# Patient Record
Sex: Female | Born: 1950 | Race: White | Hispanic: No | Smoking: Never smoker
Health system: Southern US, Community
[De-identification: ages and names within clinical notes are randomized; demographics above are authoritative.]

## PROBLEM LIST (undated history)

## (undated) DIAGNOSIS — M810 Age-related osteoporosis without current pathological fracture: Secondary | ICD-10-CM

## (undated) DIAGNOSIS — R87610 Atypical squamous cells of undetermined significance on cytologic smear of cervix (ASC-US): Secondary | ICD-10-CM

## (undated) DIAGNOSIS — R3129 Other microscopic hematuria: Secondary | ICD-10-CM

## (undated) DIAGNOSIS — K5790 Diverticulosis of intestine, part unspecified, without perforation or abscess without bleeding: Secondary | ICD-10-CM

## (undated) DIAGNOSIS — R87810 Cervical high risk human papillomavirus (HPV) DNA test positive: Secondary | ICD-10-CM

## (undated) HISTORY — DX: Atypical squamous cells of undetermined significance on cytologic smear of cervix (ASC-US): R87.610

## (undated) HISTORY — DX: Diverticulosis of intestine, part unspecified, without perforation or abscess without bleeding: K57.90

## (undated) HISTORY — DX: Age-related osteoporosis without current pathological fracture: M81.0

## (undated) HISTORY — DX: Cervical high risk human papillomavirus (HPV) DNA test positive: R87.810

## (undated) HISTORY — DX: Other microscopic hematuria: R31.29

---

## 2002-07-20 LAB — HM COLONOSCOPY

## 2010-11-24 ENCOUNTER — Ambulatory Visit: Payer: Self-pay | Admitting: Family Medicine

## 2011-12-03 ENCOUNTER — Ambulatory Visit: Payer: Self-pay | Admitting: Family Medicine

## 2012-11-28 LAB — HM DEXA SCAN

## 2012-12-05 ENCOUNTER — Ambulatory Visit: Payer: Self-pay | Admitting: Family Medicine

## 2012-12-23 ENCOUNTER — Ambulatory Visit: Payer: Self-pay | Admitting: Urology

## 2013-11-02 LAB — HM PAP SMEAR

## 2013-12-06 ENCOUNTER — Ambulatory Visit: Payer: Self-pay | Admitting: Family Medicine

## 2013-12-06 LAB — HM MAMMOGRAPHY

## 2014-01-09 ENCOUNTER — Ambulatory Visit: Payer: Self-pay | Admitting: Family Medicine

## 2014-10-29 ENCOUNTER — Other Ambulatory Visit: Payer: Self-pay

## 2014-10-29 MED ORDER — RALOXIFENE HCL 60 MG PO TABS: 60.0000 mg | ORAL_TABLET | Freq: Every day | ORAL | Status: DC

## 2014-10-29 NOTE — Telephone Encounter (Signed)
approved

## 2014-10-29 NOTE — Telephone Encounter (Signed)
Updated the message, I forgot to hit to "reorder" the medication.

## 2014-11-02 DIAGNOSIS — K5790 Diverticulosis of intestine, part unspecified, without perforation or abscess without bleeding: Secondary | ICD-10-CM | POA: Insufficient documentation

## 2014-11-02 DIAGNOSIS — M8580 Other specified disorders of bone density and structure, unspecified site: Secondary | ICD-10-CM | POA: Insufficient documentation

## 2014-11-02 DIAGNOSIS — R87810 Cervical high risk human papillomavirus (HPV) DNA test positive: Secondary | ICD-10-CM

## 2014-11-02 DIAGNOSIS — R87610 Atypical squamous cells of undetermined significance on cytologic smear of cervix (ASC-US): Secondary | ICD-10-CM | POA: Insufficient documentation

## 2014-11-02 DIAGNOSIS — R3129 Other microscopic hematuria: Secondary | ICD-10-CM | POA: Insufficient documentation

## 2014-11-12 ENCOUNTER — Encounter: Payer: Self-pay | Admitting: Family Medicine

## 2014-11-12 ENCOUNTER — Ambulatory Visit (INDEPENDENT_AMBULATORY_CARE_PROVIDER_SITE_OTHER): Payer: Medicare Other | Admitting: Family Medicine

## 2014-11-12 VITALS — BP 152/69 | HR 74 | Temp 98.4°F | Ht 59.75 in | Wt 125.0 lb

## 2014-11-12 DIAGNOSIS — M8580 Other specified disorders of bone density and structure, unspecified site: Secondary | ICD-10-CM

## 2014-11-12 DIAGNOSIS — Z1211 Encounter for screening for malignant neoplasm of colon: Secondary | ICD-10-CM | POA: Insufficient documentation

## 2014-11-12 DIAGNOSIS — Z0000 Encounter for general adult medical examination without abnormal findings: Secondary | ICD-10-CM

## 2014-11-12 MED ORDER — RALOXIFENE HCL 60 MG PO TABS: 60.0000 mg | ORAL_TABLET | Freq: Every day | ORAL | Status: DC

## 2014-11-12 NOTE — Patient Instructions (Addendum)
Your goal blood pressure is less than 150 mmHg on top. Try to follow the DASH guidelines (DASH stands for Dietary Approaches to Stop Hypertension) Try to limit the sodium in your diet.  Ideally, consume less than 1.5 grams (less than 1,554m) per day. Do not add salt when cooking or at the table.  Check the sodium amount on labels when shopping, and choose items lower in sodium when given a choice. Avoid or limit foods that already contain a lot of sodium. Eat a diet rich in fruits and vegetables and whole grains.  You can get your next cholesterol in October of 2020 and your next glucose in October of 2018 I have ordered the Cologuard test If you have not heard anything from my staff in a week about any orders/referrals/studies from today, please contact [facility] to follow-up [phone] You can have the mammogram done November 19th or later; mammograms every 1-2 years You can have your bone density test done November 11th or later; every 2 years  Health Maintenance, Female Adopting a healthy lifestyle and getting preventive care can go a long way to promote health and wellness. Talk with your health care provider about what schedule of regular examinations is right for you. This is a good chance for you to check in with your provider about disease prevention and staying healthy. In between checkups, there are plenty of things you can do on your own. Experts have done a lot of research about which lifestyle changes and preventive measures are most likely to keep you healthy. Ask your health care provider for more information. WEIGHT AND DIET  Eat a healthy diet  Be sure to include plenty of vegetables, fruits, low-fat dairy products, and lean protein.  Do not eat a lot of foods high in solid fats, added sugars, or salt.  Get regular exercise. This is one of the most important things you can do for your health.  Most adults should exercise for at least 150 minutes each week. The exercise  should increase your heart rate and make you sweat (moderate-intensity exercise).  Most adults should also do strengthening exercises at least twice a week. This is in addition to the moderate-intensity exercise.  Maintain a healthy weight  Body mass index (BMI) is a measurement that can be used to identify possible weight problems. It estimates body fat based on height and weight. Your health care provider can help determine your BMI and help you achieve or maintain a healthy weight.  For females 259years of age and older:   A BMI below 18.5 is considered underweight.  A BMI of 18.5 to 24.9 is normal.  A BMI of 25 to 29.9 is considered overweight.  A BMI of 30 and above is considered obese.  Watch levels of cholesterol and blood lipids  You should start having your blood tested for lipids and cholesterol at 66years of age, then have this test every 5 years.  You may need to have your cholesterol levels checked more often if:  Your lipid or cholesterol levels are high.  You are older than 66years of age.  You are at high risk for heart disease.  CANCER SCREENING   Lung Cancer  Lung cancer screening is recommended for adults 560860years old who are at high risk for lung cancer because of a history of smoking.  A yearly low-dose CT scan of the lungs is recommended for people who:  Currently smoke.  Have quit within the  past 15 years.  Have at least a 30-pack-year history of smoking. A pack year is smoking an average of one pack of cigarettes a day for 1 year.  Yearly screening should continue until it has been 15 years since you quit.  Yearly screening should stop if you develop a health problem that would prevent you from having lung cancer treatment.  Breast Cancer  Practice breast self-awareness. This means understanding how your breasts normally appear and feel.  It also means doing regular breast self-exams. Let your health care provider know about any  changes, no matter how small.  If you are in your 20s or 30s, you should have a clinical breast exam (CBE) by a health care provider every 1-3 years as part of a regular health exam.  If you are 61 or older, have a CBE every year. Also consider having a breast X-ray (mammogram) every year.  If you have a family history of breast cancer, talk to your health care provider about genetic screening.  If you are at high risk for breast cancer, talk to your health care provider about having an MRI and a mammogram every year.  Breast cancer gene (BRCA) assessment is recommended for women who have family members with BRCA-related cancers. BRCA-related cancers include:  Breast.  Ovarian.  Tubal.  Peritoneal cancers.  Results of the assessment will determine the need for genetic counseling and BRCA1 and BRCA2 testing. Cervical Cancer Your health care provider may recommend that you be screened regularly for cancer of the pelvic organs (ovaries, uterus, and vagina). This screening involves a pelvic examination, including checking for microscopic changes to the surface of your cervix (Pap test). You may be encouraged to have this screening done every 3 years, beginning at age 46.  For women ages 81-65, health care providers may recommend pelvic exams and Pap testing every 3 years, or they may recommend the Pap and pelvic exam, combined with testing for human papilloma virus (HPV), every 5 years. Some types of HPV increase your risk of cervical cancer. Testing for HPV may also be done on women of any age with unclear Pap test results.  Other health care providers may not recommend any screening for nonpregnant women who are considered low risk for pelvic cancer and who do not have symptoms. Ask your health care provider if a screening pelvic exam is right for you.  If you have had past treatment for cervical cancer or a condition that could lead to cancer, you need Pap tests and screening for cancer  for at least 20 years after your treatment. If Pap tests have been discontinued, your risk factors (such as having a new sexual partner) need to be reassessed to determine if screening should resume. Some women have medical problems that increase the chance of getting cervical cancer. In these cases, your health care provider may recommend more frequent screening and Pap tests. Colorectal Cancer  This type of cancer can be detected and often prevented.  Routine colorectal cancer screening usually begins at 66 years of age and continues through 66 years of age.  Your health care provider may recommend screening at an earlier age if you have risk factors for colon cancer.  Your health care provider may also recommend using home test kits to check for hidden blood in the stool.  A small camera at the end of a tube can be used to examine your colon directly (sigmoidoscopy or colonoscopy). This is done to check for the earliest forms  of colorectal cancer.  Routine screening usually begins at age 41.  Direct examination of the colon should be repeated every 5-10 years through 66 years of age. However, you may need to be screened more often if early forms of precancerous polyps or small growths are found. Skin Cancer  Check your skin from head to toe regularly.  Tell your health care provider about any new moles or changes in moles, especially if there is a change in a mole's shape or color.  Also tell your health care provider if you have a mole that is larger than the size of a pencil eraser.  Always use sunscreen. Apply sunscreen liberally and repeatedly throughout the day.  Protect yourself by wearing long sleeves, pants, a wide-brimmed hat, and sunglasses whenever you are outside. HEART DISEASE, DIABETES, AND HIGH BLOOD PRESSURE   High blood pressure causes heart disease and increases the risk of stroke. High blood pressure is more likely to develop in:  People who have blood pressure in  the high end of the normal range (130-139/85-89 mm Hg).  People who are overweight or obese.  People who are African American.  If you are 50-90 years of age, have your blood pressure checked every 3-5 years. If you are 1 years of age or older, have your blood pressure checked every year. You should have your blood pressure measured twice--once when you are at a hospital or clinic, and once when you are not at a hospital or clinic. Record the average of the two measurements. To check your blood pressure when you are not at a hospital or clinic, you can use:  An automated blood pressure machine at a pharmacy.  A home blood pressure monitor.  If you are between 64 years and 58 years old, ask your health care provider if you should take aspirin to prevent strokes.  Have regular diabetes screenings. This involves taking a blood sample to check your fasting blood sugar level.  If you are at a normal weight and have a low risk for diabetes, have this test once every three years after 66 years of age.  If you are overweight and have a high risk for diabetes, consider being tested at a younger age or more often. PREVENTING INFECTION  Hepatitis B  If you have a higher risk for hepatitis B, you should be screened for this virus. You are considered at high risk for hepatitis B if:  You were born in a country where hepatitis B is common. Ask your health care provider which countries are considered high risk.  Your parents were born in a high-risk country, and you have not been immunized against hepatitis B (hepatitis B vaccine).  You have HIV or AIDS.  You use needles to inject street drugs.  You live with someone who has hepatitis B.  You have had sex with someone who has hepatitis B.  You get hemodialysis treatment.  You take certain medicines for conditions, including cancer, organ transplantation, and autoimmune conditions. Hepatitis C  Blood testing is recommended for:  Everyone  born from [birth year] through 1967.  Anyone with known risk factors for hepatitis C. Sexually transmitted infections (STIs)  You should be screened for sexually transmitted infections (STIs) including gonorrhea and chlamydia if:  You are sexually active and are younger than 66 years of age.  You are older than 66 years of age and your health care provider tells you that you are at risk for this type of infection.  Your  sexual activity has changed since you were last screened and you are at an increased risk for chlamydia or gonorrhea. Ask your health care provider if you are at risk.  If you do not have HIV, but are at risk, it may be recommended that you take a prescription medicine daily to prevent HIV infection. This is called pre-exposure prophylaxis (PrEP). You are considered at risk if:  You are sexually active and do not regularly use condoms or know the HIV status of your partner(s).  You take drugs by injection.  You are sexually active with a partner who has HIV. Talk with your health care provider about whether you are at high risk of being infected with HIV. If you choose to begin PrEP, you should first be tested for HIV. You should then be tested every 3 months for as long as you are taking PrEP.  PREGNANCY   If you are premenopausal and you may become pregnant, ask your health care provider about preconception counseling.  If you may become pregnant, take 400 to 800 micrograms (mcg) of folic acid every day.  If you want to prevent pregnancy, talk to your health care provider about birth control (contraception). OSTEOPOROSIS AND MENOPAUSE   Osteoporosis is a disease in which the bones lose minerals and strength with aging. This can result in serious bone fractures. Your risk for osteoporosis can be identified using a bone density scan.  If you are 65 years of age or older, or if you are at risk for osteoporosis and fractures, ask your health care provider if you should be  screened.  Ask your health care provider whether you should take a calcium or vitamin D supplement to lower your risk for osteoporosis.  Menopause may have certain physical symptoms and risks.  Hormone replacement therapy may reduce some of these symptoms and risks. Talk to your health care provider about whether hormone replacement therapy is right for you.  HOME CARE INSTRUCTIONS   Schedule regular health, dental, and eye exams.  Stay current with your immunizations.   Do not use any tobacco products including cigarettes, chewing tobacco, or electronic cigarettes.  If you are pregnant, do not drink alcohol.  If you are breastfeeding, limit how much and how often you drink alcohol.  Limit alcohol intake to no more than 1 drink per day for nonpregnant women. One drink equals 12 ounces of beer, 5 ounces of wine, or 1 ounces of hard liquor.  Do not use street drugs.  Do not share needles.  Ask your health care provider for help if you need support or information about quitting drugs.  Tell your health care provider if you often feel depressed.  Tell your health care provider if you have ever been abused or do not feel safe at home.   This information is not intended to replace advice given to you by your health care provider. Make sure you discuss any questions you have with your health care provider.   Document Released: 07/21/2010 Document Revised: 01/26/2014 Document Reviewed: 12/07/2012 Elsevier Interactive Patient Education [employer].

## 2014-11-12 NOTE — Progress Notes (Signed)
Patient ID: Chelsea Hughes, female   DOB: 1950/01/28, 64 y.o.   MRN: [id number]   Subjective:   Chelsea Hughes is a 64 y.o. female here for a complete physical exam  Interim issues since last visit: none  USPSTF grade A and B recommendations Alcohol: wine less than 7 drinks per week Depression:  Depression screen [facility] 2/9 11/12/2014  Decreased Interest 0  Down, Depressed, Hopeless 0  PHQ - 2 Score 0  Hypertension: up a little today; discussed Obesity: no, but did gain a little weight since last year Tobacco use: no HIV, hep B, hep C: politely declined STD testing and prevention (chl/gon/syphilis): politely declined Lipids: excellent; reviewed last panel Glucose: excellent; reviewed last glucose Colorectal cancer:  She does not do the colonoscopy Breast cancer: ordered today BRCA gene screening: no ovarian or breast cancer in the family Intimate partner violence: no Cervical cancer screening: UTD; next in 2 years, that should be last one Lung cancer: n/a Osteoporosis: osteopenia, last DEXA Nov 2014 Fall prevention/vitamin D: discussed AAA: n/a Aspirin: taking 81 mg daily Diet: good diet Exercise: garden, walks Skin cancer: uses sunscreen, sees derm yearly  Past Medical History  Diagnosis Date  . Microscopic hematuria     evaluated by Urology in 2014, abd/pelvis CT scan Dec 2014  . Osteoporosis   . Diverticulosis     on Colonoscopy 2007; CT scan 2014  . Atypical squamous cell changes of undetermined significance (ASCUS) on cervical cytology with positive high risk human papilloma virus (HPV)   Three negative pap smears since then, 2015, 2012, 2009, all NIL and satisfactory and negative  History reviewed. No pertinent past surgical history. Family History  Problem Relation Age of Onset  . Heart disease Mother     CABG  . Hypertension Mother   . Osteoporosis Mother   . Thyroid disease Mother   . Heart disease Father   . Hypertension Father   . Cancer Father    throat (smoker)  . COPD Neg Hx   . Diabetes Neg Hx   . Stroke Neg Hx    Social History  Substance Use Topics  . Smoking status: Never Smoker   . Smokeless tobacco: Never Used  . Alcohol Use: Yes     Comment: wine   Review of Systems  Constitutional: Negative for fever and unexpected weight change.  HENT: Negative for hearing loss and nosebleeds.   Eyes: Negative for visual disturbance.  Respiratory: Negative for shortness of breath and wheezing.   Cardiovascular: Negative for chest pain and leg swelling.  Gastrointestinal: Negative for abdominal pain and blood in stool.  Endocrine: Negative for polydipsia and polyuria.  Genitourinary: Negative for hematuria and pelvic pain.  Musculoskeletal: Negative for joint swelling and arthralgias.  Skin:       No worrisome moles  Allergic/Immunologic: Negative for food allergies.  Neurological: Negative for tremors.  Hematological: Negative for adenopathy. Does not bruise/bleed easily.  Psychiatric/Behavioral: Negative for dysphoric mood.    Objective:   Filed Vitals:   11/12/14 1409  BP: 152/69  Pulse: 74  Temp: 98.4 F (36.9 C)  Height: 4' 11.75" (1.518 m)  Weight: 125 lb (56.7 kg)  SpO2: 98%   Body mass index is 24.61 kg/(m^2). Wt Readings from Last 3 Encounters:  11/12/14 125 lb (56.7 kg)  01/01/14 121 lb (54.885 kg)   Physical Exam  Constitutional: She appears well-developed and well-nourished.  HENT:  Head: Normocephalic and atraumatic.  Right Ear: Hearing, tympanic membrane, external ear and  ear canal normal.  Left Ear: Hearing, tympanic membrane, external ear and ear canal normal.  Eyes: Conjunctivae and EOM are normal. Right eye exhibits no hordeolum. Left eye exhibits no hordeolum. No scleral icterus.  Neck: Carotid bruit is not present. No thyromegaly present.  Cardiovascular: Normal rate, regular rhythm, S1 normal, S2 normal and normal heart sounds.   No extrasystoles are present.  Pulmonary/Chest: Effort  normal and breath sounds normal. No respiratory distress. Right breast exhibits no inverted nipple, no mass, no nipple discharge, no skin change and no tenderness. Left breast exhibits no inverted nipple, no mass, no nipple discharge, no skin change and no tenderness. Breasts are symmetrical.  Abdominal: Soft. Normal appearance and bowel sounds are normal. She exhibits no distension, no abdominal bruit, no pulsatile midline mass and no mass. There is no hepatosplenomegaly. There is no tenderness. No hernia.  Musculoskeletal: Normal range of motion. She exhibits no edema.  Lymphadenopathy:       Head (right side): No submandibular adenopathy present.       Head (left side): No submandibular adenopathy present.    She has no cervical adenopathy.    She has no axillary adenopathy.  Neurological: She is alert. She displays no tremor. No cranial nerve deficit. She exhibits normal muscle tone. Gait normal.  Reflex Scores:      Patellar reflexes are 2+ on the right side and 2+ on the left side. Skin: Skin is warm and dry. No bruising and no ecchymosis noted. No cyanosis. No pallor.  Psychiatric: Her speech is normal and behavior is normal. Thought content normal. Her mood appears not anxious. She does not exhibit a depressed mood.   Assessment/Plan:   Problem List Items Addressed This Visit      Musculoskeletal and Integument   Osteopenia    Due Nov 11th or just after; every 2 years; continue calcium and vit D; fall precautions        Other   Colon cancer screening    She is not interested in having a colonoscopy; explained this is the gold standard, but another test available for patients who do not want colonoscopy; will order Cologuard      Relevant Orders   Cologuard   Preventative health care - Primary    USPSTF grade A and B recommendations reviewed with patient; age-appropriate recommendations, preventive care, screening tests, etc discussed and encouraged; healthy living encouraged;  see AVS for patient education given to patient         Follow up plan: Return in about 1 year (around 11/12/2015) for complete physical, medicare wellness when due.  An after-visit summary was printed and given to the patient at [facility].  Please see the patient instructions which may contain other information and recommendations beyond what is mentioned above in the assessment and plan.

## 2014-11-12 NOTE — Assessment & Plan Note (Signed)
Due Nov 11th or just after; every 2 years; continue calcium and vit D; fall precautions

## 2014-11-17 DIAGNOSIS — Z0000 Encounter for general adult medical examination without abnormal findings: Secondary | ICD-10-CM | POA: Insufficient documentation

## 2014-11-17 NOTE — Assessment & Plan Note (Signed)
USPSTF grade A and B recommendations reviewed with patient; age-appropriate recommendations, preventive care, screening tests, etc discussed and encouraged; healthy living encouraged; see AVS for patient education given to patient  

## 2014-11-17 NOTE — Assessment & Plan Note (Signed)
She is not interested in having a colonoscopy; explained this is the gold standard, but another test available for patients who do not want colonoscopy; will order Cologuard

## 2014-12-11 ENCOUNTER — Other Ambulatory Visit: Payer: Self-pay

## 2014-12-11 DIAGNOSIS — M8580 Other specified disorders of bone density and structure, unspecified site: Secondary | ICD-10-CM

## 2014-12-12 ENCOUNTER — Other Ambulatory Visit: Payer: Self-pay | Admitting: Family Medicine

## 2014-12-12 DIAGNOSIS — Z1231 Encounter for screening mammogram for malignant neoplasm of breast: Secondary | ICD-10-CM

## 2014-12-26 ENCOUNTER — Ambulatory Visit
Admission: RE | Admit: 2014-12-26 | Discharge: 2014-12-26 | Disposition: A | Discharge status: Home / Self Care | Payer: Medicare Other | Source: Ambulatory Visit | Attending: Family Medicine | Admitting: Family Medicine

## 2014-12-26 DIAGNOSIS — Z13820 Encounter for screening for osteoporosis: Secondary | ICD-10-CM | POA: Insufficient documentation

## 2014-12-26 DIAGNOSIS — M8580 Other specified disorders of bone density and structure, unspecified site: Secondary | ICD-10-CM | POA: Insufficient documentation

## 2014-12-26 DIAGNOSIS — Z1231 Encounter for screening mammogram for malignant neoplasm of breast: Secondary | ICD-10-CM | POA: Insufficient documentation

## 2014-12-31 ENCOUNTER — Telehealth: Payer: Self-pay | Admitting: Family Medicine

## 2014-12-31 DIAGNOSIS — M8580 Other specified disorders of bone density and structure, unspecified site: Secondary | ICD-10-CM

## 2014-12-31 NOTE — Telephone Encounter (Signed)
Her DEXA showed osteopenia; I will call her tomorrow

## 2015-01-01 NOTE — Telephone Encounter (Signed)
Discussed DEXA; continue evista, no probs with it; 3 servings calcium daily, vit D 1000 daily, weight-bearing exercise, fall precautions; patient agrees

## 2015-01-03 LAB — COLOGUARD: COLOGUARD: NEGATIVE

## 2015-01-11 ENCOUNTER — Telehealth: Payer: Self-pay | Admitting: Family Medicine

## 2015-01-11 NOTE — Telephone Encounter (Signed)
Cologuard done on Jan 03, 2015; please add to health maintenance Let patient know it was negative; recheck in 1-3 years

## 2015-01-23 NOTE — Telephone Encounter (Signed)
Left message to call. Health Maintenance updated.

## 2015-01-23 NOTE — Telephone Encounter (Signed)
Amy, please give patient good news noted below; update HM

## 2015-01-24 NOTE — Telephone Encounter (Signed)
Left message to call.

## 2015-01-29 NOTE — Telephone Encounter (Signed)
Patient notified

## 2015-11-25 ENCOUNTER — Other Ambulatory Visit: Payer: Self-pay | Admitting: Family Medicine

## 2015-11-25 NOTE — Telephone Encounter (Signed)
[facility], so [facility]; I left the practice; thank you

## 2015-11-25 NOTE — Telephone Encounter (Signed)
Your patient.  Thanks 

## 2015-11-25 NOTE — Telephone Encounter (Signed)
[facility]

## 2015-11-29 ENCOUNTER — Encounter: Payer: Self-pay | Admitting: Family Medicine

## 2015-11-29 ENCOUNTER — Ambulatory Visit (INDEPENDENT_AMBULATORY_CARE_PROVIDER_SITE_OTHER): Payer: Medicare Other | Admitting: Family Medicine

## 2015-11-29 VITALS — BP 133/73 | HR 71 | Temp 98.0°F | Ht 59.4 in | Wt 124.7 lb

## 2015-11-29 DIAGNOSIS — M8580 Other specified disorders of bone density and structure, unspecified site: Secondary | ICD-10-CM | POA: Diagnosis not present

## 2015-11-29 DIAGNOSIS — Z23 Encounter for immunization: Secondary | ICD-10-CM

## 2015-11-29 DIAGNOSIS — Z0000 Encounter for general adult medical examination without abnormal findings: Secondary | ICD-10-CM

## 2015-11-29 DIAGNOSIS — Z1159 Encounter for screening for other viral diseases: Secondary | ICD-10-CM | POA: Diagnosis not present

## 2015-11-29 DIAGNOSIS — R3129 Other microscopic hematuria: Secondary | ICD-10-CM

## 2015-11-29 DIAGNOSIS — Z1239 Encounter for other screening for malignant neoplasm of breast: Secondary | ICD-10-CM

## 2015-11-29 DIAGNOSIS — Z1231 Encounter for screening mammogram for malignant neoplasm of breast: Secondary | ICD-10-CM | POA: Diagnosis not present

## 2015-11-29 LAB — UA/M W/RFLX CULTURE, ROUTINE
BILIRUBIN UA: NEGATIVE
GLUCOSE, UA: NEGATIVE
KETONES UA: NEGATIVE
LEUKOCYTES UA: NEGATIVE
NITRITE UA: NEGATIVE
Protein, UA: NEGATIVE
Urobilinogen, Ur: 0.2 mg/dL (ref 0.2–1.0)
pH, UA: 5 (ref 5.0–7.5)

## 2015-11-29 LAB — MICROSCOPIC EXAMINATION
BACTERIA UA: NONE SEEN
WBC UA: NONE SEEN /HPF (ref 0–?)

## 2015-11-29 MED ORDER — RALOXIFENE HCL 60 MG PO TABS: ORAL_TABLET | ORAL | 4 refills | Status: DC

## 2015-11-29 NOTE — Patient Instructions (Addendum)
Preventative Services:  Health Risk Assessment and Personalized Prevention Plan: Done today Bone Mass Measurements: Due next year Breast Cancer Screening: Ordered today CVD Screening: Done today Cervical Cancer Screening: N/A Colon Cancer Screening: up to date Depression Screening: done today Diabetes Screening: Done today Glaucoma Screening: See your eye doctor Hepatitis B vaccine: n/a Hepatitis C screening: done today HIV Screening: N/A Flu Vaccine: up to date Lung cancer Screening: N/A Obesity Screening: done today Pneumonia Vaccines (2): completed today STI Screening: N/AHealth Maintenance, Female Adopting a healthy lifestyle and getting preventive care can go a long way to promote health and wellness. Talk with your health care provider about what schedule of regular examinations is right for you. This is a good chance for you to check in with your provider about disease prevention and staying healthy. In between checkups, there are plenty of things you can do on your own. Experts have done a lot of research about which lifestyle changes and preventive measures are most likely to keep you healthy. Ask your health care provider for more information. WEIGHT AND DIET  Eat a healthy diet  Be sure to include plenty of vegetables, fruits, low-fat dairy products, and lean protein.  Do not eat a lot of foods high in solid fats, added sugars, or salt.  Get regular exercise. This is one of the most important things you can do for your health.  Most adults should exercise for at least 150 minutes each week. The exercise should increase your heart rate and make you sweat (moderate-intensity exercise).  Most adults should also do strengthening exercises at least twice a week. This is in addition to the moderate-intensity exercise.  Maintain a healthy weight  Body mass index (BMI) is a measurement that can be used to identify possible weight problems. It estimates body fat based on height  and weight. Your health care provider can help determine your BMI and help you achieve or maintain a healthy weight.  For females 43 years of age and older:   A BMI below 18.5 is considered underweight.  A BMI of 18.5 to 24.9 is normal.  A BMI of 25 to 29.9 is considered overweight.  A BMI of 30 and above is considered obese.  Watch levels of cholesterol and blood lipids  You should start having your blood tested for lipids and cholesterol at 67 years of age, then have this test every 5 years.  You may need to have your cholesterol levels checked more often if:  Your lipid or cholesterol levels are high.  You are older than 67 years of age.  You are at high risk for heart disease.  CANCER SCREENING   Lung Cancer  Lung cancer screening is recommended for adults 28-53 years old who are at high risk for lung cancer because of a history of smoking.  A yearly low-dose CT scan of the lungs is recommended for people who:  Currently smoke.  Have quit within the past 15 years.  Have at least a 30-pack-year history of smoking. A pack year is smoking an average of one pack of cigarettes a day for 1 year.  Yearly screening should continue until it has been 15 years since you quit.  Yearly screening should stop if you develop a health problem that would prevent you from having lung cancer treatment.  Breast Cancer  Practice breast self-awareness. This means understanding how your breasts normally appear and feel.  It also means doing regular breast self-exams. Let your health care provider  know about any changes, no matter how small.  If you are in your 20s or 30s, you should have a clinical breast exam (CBE) by a health care provider every 1-3 years as part of a regular health exam.  If you are 11 or older, have a CBE every year. Also consider having a breast X-ray (mammogram) every year.  If you have a family history of breast cancer, talk to your health care provider about  genetic screening.  If you are at high risk for breast cancer, talk to your health care provider about having an MRI and a mammogram every year.  Breast cancer gene (BRCA) assessment is recommended for women who have family members with BRCA-related cancers. BRCA-related cancers include:  Breast.  Ovarian.  Tubal.  Peritoneal cancers.  Results of the assessment will determine the need for genetic counseling and BRCA1 and BRCA2 testing. Cervical Cancer Your health care provider may recommend that you be screened regularly for cancer of the pelvic organs (ovaries, uterus, and vagina). This screening involves a pelvic examination, including checking for microscopic changes to the surface of your cervix (Pap test). You may be encouraged to have this screening done every 3 years, beginning at age 24.  For women ages 25-65, health care providers may recommend pelvic exams and Pap testing every 3 years, or they may recommend the Pap and pelvic exam, combined with testing for human papilloma virus (HPV), every 5 years. Some types of HPV increase your risk of cervical cancer. Testing for HPV may also be done on women of any age with unclear Pap test results.  Other health care providers may not recommend any screening for nonpregnant women who are considered low risk for pelvic cancer and who do not have symptoms. Ask your health care provider if a screening pelvic exam is right for you.  If you have had past treatment for cervical cancer or a condition that could lead to cancer, you need Pap tests and screening for cancer for at least 20 years after your treatment. If Pap tests have been discontinued, your risk factors (such as having a new sexual partner) need to be reassessed to determine if screening should resume. Some women have medical problems that increase the chance of getting cervical cancer. In these cases, your health care provider may recommend more frequent screening and Pap  tests. Colorectal Cancer  This type of cancer can be detected and often prevented.  Routine colorectal cancer screening usually begins at 67 years of age and continues through 67 years of age.  Your health care provider may recommend screening at an earlier age if you have risk factors for colon cancer.  Your health care provider may also recommend using home test kits to check for hidden blood in the stool.  A small camera at the end of a tube can be used to examine your colon directly (sigmoidoscopy or colonoscopy). This is done to check for the earliest forms of colorectal cancer.  Routine screening usually begins at age 73.  Direct examination of the colon should be repeated every 5-10 years through 67 years of age. However, you may need to be screened more often if early forms of precancerous polyps or small growths are found. Skin Cancer  Check your skin from head to toe regularly.  Tell your health care provider about any new moles or changes in moles, especially if there is a change in a mole's shape or color.  Also tell your health care provider if  you have a mole that is larger than the size of a pencil eraser.  Always use sunscreen. Apply sunscreen liberally and repeatedly throughout the day.  Protect yourself by wearing long sleeves, pants, a wide-brimmed hat, and sunglasses whenever you are outside. HEART DISEASE, DIABETES, AND HIGH BLOOD PRESSURE   High blood pressure causes heart disease and increases the risk of stroke. High blood pressure is more likely to develop in:  People who have blood pressure in the high end of the normal range (130-139/85-89 mm Hg).  People who are overweight or obese.  People who are African American.  If you are 41-4 years of age, have your blood pressure checked every 3-5 years. If you are 47 years of age or older, have your blood pressure checked every year. You should have your blood pressure measured twice--once when you are at a  hospital or clinic, and once when you are not at a hospital or clinic. Record the average of the two measurements. To check your blood pressure when you are not at a hospital or clinic, you can use:  An automated blood pressure machine at a pharmacy.  A home blood pressure monitor.  If you are between 27 years and 1 years old, ask your health care provider if you should take aspirin to prevent strokes.  Have regular diabetes screenings. This involves taking a blood sample to check your fasting blood sugar level.  If you are at a normal weight and have a low risk for diabetes, have this test once every three years after 67 years of age.  If you are overweight and have a high risk for diabetes, consider being tested at a younger age or more often. PREVENTING INFECTION  Hepatitis B  If you have a higher risk for hepatitis B, you should be screened for this virus. You are considered at high risk for hepatitis B if:  You were born in a country where hepatitis B is common. Ask your health care provider which countries are considered high risk.  Your parents were born in a high-risk country, and you have not been immunized against hepatitis B (hepatitis B vaccine).  You have HIV or AIDS.  You use needles to inject street drugs.  You live with someone who has hepatitis B.  You have had sex with someone who has hepatitis B.  You get hemodialysis treatment.  You take certain medicines for conditions, including cancer, organ transplantation, and autoimmune conditions. Hepatitis C  Blood testing is recommended for:  Everyone born from [birth year] through 1967.  Anyone with known risk factors for hepatitis C. Sexually transmitted infections (STIs)  You should be screened for sexually transmitted infections (STIs) including gonorrhea and chlamydia if:  You are sexually active and are younger than 67 years of age.  You are older than 67 years of age and your health care provider tells you  that you are at risk for this type of infection.  Your sexual activity has changed since you were last screened and you are at an increased risk for chlamydia or gonorrhea. Ask your health care provider if you are at risk.  If you do not have HIV, but are at risk, it may be recommended that you take a prescription medicine daily to prevent HIV infection. This is called pre-exposure prophylaxis (PrEP). You are considered at risk if:  You are sexually active and do not regularly use condoms or know the HIV status of your partner(s).  You take drugs by injection.  You are sexually active with a partner who has HIV. Talk with your health care provider about whether you are at high risk of being infected with HIV. If you choose to begin PrEP, you should first be tested for HIV. You should then be tested every 3 months for as long as you are taking PrEP.  PREGNANCY   If you are premenopausal and you may become pregnant, ask your health care provider about preconception counseling.  If you may become pregnant, take 400 to 800 micrograms (mcg) of folic acid every day.  If you want to prevent pregnancy, talk to your health care provider about birth control (contraception). OSTEOPOROSIS AND MENOPAUSE   Osteoporosis is a disease in which the bones lose minerals and strength with aging. This can result in serious bone fractures. Your risk for osteoporosis can be identified using a bone density scan.  If you are 64 years of age or older, or if you are at risk for osteoporosis and fractures, ask your health care provider if you should be screened.  Ask your health care provider whether you should take a calcium or vitamin D supplement to lower your risk for osteoporosis.  Menopause may have certain physical symptoms and risks.  Hormone replacement therapy may reduce some of these symptoms and risks. Talk to your health care provider about whether hormone replacement therapy is right for you.  HOME  CARE INSTRUCTIONS   Schedule regular health, dental, and eye exams.  Stay current with your immunizations.   Do not use any tobacco products including cigarettes, chewing tobacco, or electronic cigarettes.  If you are pregnant, do not drink alcohol.  If you are breastfeeding, limit how much and how often you drink alcohol.  Limit alcohol intake to no more than 1 drink per day for nonpregnant women. One drink equals 12 ounces of beer, 5 ounces of wine, or 1 ounces of hard liquor.  Do not use street drugs.  Do not share needles.  Ask your health care provider for help if you need support or information about quitting drugs.  Tell your health care provider if you often feel depressed.  Tell your health care provider if you have ever been abused or do not feel safe at home.   This information is not intended to replace advice given to you by your health care provider. Make sure you discuss any questions you have with your health care provider.   Document Released: 07/21/2010 Document Revised: 01/26/2014 Document Reviewed: 12/07/2012 Elsevier Interactive Patient Education [employer]. Menopause is a normal process in which your reproductive ability comes to an end. This process happens gradually over a span of months to years, usually between the ages of 2 and 80. Menopause is complete when you have missed 12 consecutive menstrual periods. It is important to talk with your health care provider about some of the most common conditions that affect postmenopausal women, such as heart disease, cancer, and bone loss (osteoporosis). Adopting a healthy lifestyle and getting preventive care can help to promote your health and wellness. Those actions can also lower your chances of developing some of these common conditions. WHAT SHOULD I KNOW ABOUT MENOPAUSE? During menopause, you may experience a number of symptoms, such as:  Moderate-to-severe hot flashes.  Night sweats.  Decrease  in sex drive.  Mood swings.  Headaches.  Tiredness.  Irritability.  Memory problems.  Insomnia. Choosing to treat or not to treat menopausal changes is an individual decision that you make with  your health care provider. WHAT SHOULD I KNOW ABOUT HORMONE REPLACEMENT THERAPY AND SUPPLEMENTS? Hormone therapy products are effective for treating symptoms that are associated with menopause, such as hot flashes and night sweats. Hormone replacement carries certain risks, especially as you become older. If you are thinking about using estrogen or estrogen with progestin treatments, discuss the benefits and risks with your health care provider. WHAT SHOULD I KNOW ABOUT HEART DISEASE AND STROKE? Heart disease, heart attack, and stroke become more likely as you age. This may be due, in part, to the hormonal changes that your body experiences during menopause. These can affect how your body processes dietary fats, triglycerides, and cholesterol. Heart attack and stroke are both medical emergencies. There are many things that you can do to help prevent heart disease and stroke:  Have your blood pressure checked at least every 1-2 years. High blood pressure causes heart disease and increases the risk of stroke.  If you are 33-44 years old, ask your health care provider if you should take aspirin to prevent a heart attack or a stroke.  Do not use any tobacco products, including cigarettes, chewing tobacco, or electronic cigarettes. If you need help quitting, ask your health care provider.  It is important to eat a healthy diet and maintain a healthy weight.  Be sure to include plenty of vegetables, fruits, low-fat dairy products, and lean protein.  Avoid eating foods that are high in solid fats, added sugars, or salt (sodium).  Get regular exercise. This is one of the most important things that you can do for your health.  Try to exercise for at least 150 minutes each week. The type of exercise  that you do should increase your heart rate and make you sweat. This is known as moderate-intensity exercise.  Try to do strengthening exercises at least twice each week. Do these in addition to the moderate-intensity exercise.  Know your numbers.Ask your health care provider to check your cholesterol and your blood glucose. Continue to have your blood tested as directed by your health care provider. WHAT SHOULD I KNOW ABOUT CANCER SCREENING? There are several types of cancer. Take the following steps to reduce your risk and to catch any cancer development as early as possible. Breast Cancer  Practice breast self-awareness.  This means understanding how your breasts normally appear and feel.  It also means doing regular breast self-exams. Let your health care provider know about any changes, no matter how small.  If you are 69 or older, have a clinician do a breast exam (clinical breast exam or CBE) every year. Depending on your age, family history, and medical history, it may be recommended that you also have a yearly breast X-ray (mammogram).  If you have a family history of breast cancer, talk with your health care provider about genetic screening.  If you are at high risk for breast cancer, talk with your health care provider about having an MRI and a mammogram every year.  Breast cancer (BRCA) gene test is recommended for women who have family members with BRCA-related cancers. Results of the assessment will determine the need for genetic counseling and BRCA1 and for BRCA2 testing. BRCA-related cancers include these types:  Breast. This occurs in males or females.  Ovarian.  Tubal. This may also be called fallopian tube cancer.  Cancer of the abdominal or pelvic lining (peritoneal cancer).  Prostate.  Pancreatic. Cervical, Uterine, and Ovarian Cancer Your health care provider may recommend that you be screened regularly  for cancer of the pelvic organs. These include your  ovaries, uterus, and vagina. This screening involves a pelvic exam, which includes checking for microscopic changes to the surface of your cervix (Pap test).  For women ages 21-65, health care providers may recommend a pelvic exam and a Pap test every three years. For women ages 60-65, they may recommend the Pap test and pelvic exam, combined with testing for human papilloma virus (HPV), every five years. Some types of HPV increase your risk of cervical cancer. Testing for HPV may also be done on women of any age who have unclear Pap test results.  Other health care providers may not recommend any screening for nonpregnant women who are considered low risk for pelvic cancer and have no symptoms. Ask your health care provider if a screening pelvic exam is right for you.  If you have had past treatment for cervical cancer or a condition that could lead to cancer, you need Pap tests and screening for cancer for at least 20 years after your treatment. If Pap tests have been discontinued for you, your risk factors (such as having a new sexual partner) need to be reassessed to determine if you should start having screenings again. Some women have medical problems that increase the chance of getting cervical cancer. In these cases, your health care provider may recommend that you have screening and Pap tests more often.  If you have a family history of uterine cancer or ovarian cancer, talk with your health care provider about genetic screening.  If you have vaginal bleeding after reaching menopause, tell your health care provider.  There are currently no reliable tests available to screen for ovarian cancer. Lung Cancer Lung cancer screening is recommended for adults 68-69 years old who are at high risk for lung cancer because of a history of smoking. A yearly low-dose CT scan of the lungs is recommended if you:  Currently smoke.  Have a history of at least 30 pack-years of smoking and you currently  smoke or have quit within the past 15 years. A pack-year is smoking an average of one pack of cigarettes per day for one year. Yearly screening should:  Continue until it has been 15 years since you quit.  Stop if you develop a health problem that would prevent you from having lung cancer treatment. Colorectal Cancer  This type of cancer can be detected and can often be prevented.  Routine colorectal cancer screening usually begins at age 48 and continues through age 32.  If you have risk factors for colon cancer, your health care provider may recommend that you be screened at an earlier age.  If you have a family history of colorectal cancer, talk with your health care provider about genetic screening.  Your health care provider may also recommend using home test kits to check for hidden blood in your stool.  A small camera at the end of a tube can be used to examine your colon directly (sigmoidoscopy or colonoscopy). This is done to check for the earliest forms of colorectal cancer.  Direct examination of the colon should be repeated every 5-10 years until age 76. However, if early forms of precancerous polyps or small growths are found or if you have a family history or genetic risk for colorectal cancer, you may need to be screened more often. Skin Cancer  Check your skin from head to toe regularly.  Monitor any moles. Be sure to tell your health care provider:  About  any new moles or changes in moles, especially if there is a change in a mole's shape or color.  If you have a mole that is larger than the size of a pencil eraser.  If any of your family members has a history of skin cancer, especially at a young age, talk with your health care provider about genetic screening.  Always use sunscreen. Apply sunscreen liberally and repeatedly throughout the day.  Whenever you are outside, protect yourself by wearing long sleeves, pants, a wide-brimmed hat, and sunglasses. WHAT  SHOULD I KNOW ABOUT OSTEOPOROSIS? Osteoporosis is a condition in which bone destruction happens more quickly than new bone creation. After menopause, you may be at an increased risk for osteoporosis. To help prevent osteoporosis or the bone fractures that can happen because of osteoporosis, the following is recommended:  If you are 48-49 years old, get at least 1,000 mg of calcium and at least 600 mg of vitamin D per day.  If you are older than age 59 but younger than age 38, get at least 1,200 mg of calcium and at least 600 mg of vitamin D per day.  If you are older than age 51, get at least 1,200 mg of calcium and at least 800 mg of vitamin D per day. Smoking and excessive alcohol intake increase the risk of osteoporosis. Eat foods that are rich in calcium and vitamin D, and do weight-bearing exercises several times each week as directed by your health care provider. WHAT SHOULD I KNOW ABOUT HOW MENOPAUSE AFFECTS [place]? Depression may occur at any age, but it is more common as you become older. Common symptoms of depression include:  Low or sad mood.  Changes in sleep patterns.  Changes in appetite or eating patterns.  Feeling an overall lack of motivation or enjoyment of activities that you previously enjoyed.  Frequent crying spells. Talk with your health care provider if you think that you are experiencing depression. WHAT SHOULD I KNOW ABOUT IMMUNIZATIONS? It is important that you get and maintain your immunizations. These include:  Tetanus, diphtheria, and pertussis (Tdap) booster vaccine.  Influenza every year before the flu season begins.  Pneumonia vaccine.  Shingles vaccine. Your health care provider may also recommend other immunizations.   This information is not intended to replace advice given to you by your health care provider. Make sure you discuss any questions you have with your health care provider.   Document Released: 02/27/2005 Document Revised:  01/26/2014 Document Reviewed: 09/07/2013 Elsevier Interactive Patient Education 2016 [place]. Pneumococcal Vaccine, Polyvalent solution for injection What is this medicine? PNEUMOCOCCAL VACCINE, POLYVALENT (NEU mo KOK al vak SEEN, pol ee VEY luhnt) is a vaccine to prevent pneumococcus bacteria infection. These bacteria are a major cause of ear infections, Strep throat infections, and serious pneumonia, meningitis, or blood infections worldwide. These vaccines help the body to produce antibodies (protective substances) that help your body defend against these bacteria. This vaccine is recommended for people 48 years of age and older with health problems. It is also recommended for all adults over 56 years old. This vaccine will not treat an infection. This medicine may be used for other purposes; ask your health care provider or pharmacist if you have questions. What should I tell my health care provider before I take this medicine? They need to know if you have any of these conditions: -bleeding problems -bone marrow or organ transplant -cancer, Hodgkin's disease -fever -infection -immune system problems -low platelet count  in the blood -seizures -an unusual or allergic reaction to pneumococcal vaccine, diphtheria toxoid, other vaccines, latex, other medicines, foods, dyes, or preservatives -pregnant or trying to get pregnant -breast-feeding How should I use this medicine? This vaccine is for injection into a muscle or under the skin. It is given by a health care professional. A copy of Vaccine Information Statements will be given before each vaccination. Read this sheet carefully each time. The sheet may change frequently. Talk to your pediatrician regarding the use of this medicine in children. While this drug may be prescribed for children as young as 44 years of age for selected conditions, precautions do apply. Overdosage: If you think you have taken too much of this medicine contact  a poison control center or emergency room at once. NOTE: This medicine is only for you. Do not share this medicine with others. What if I miss a dose? It is important not to miss your dose. Call your doctor or health care professional if you are unable to keep an appointment. What may interact with this medicine? -medicines for cancer chemotherapy -medicines that suppress your immune function -medicines that treat or prevent blood clots like warfarin, enoxaparin, and dalteparin -steroid medicines like prednisone or cortisone This list may not describe all possible interactions. Give your health care provider a list of all the medicines, herbs, non-prescription drugs, or dietary supplements you use. Also tell them if you smoke, drink alcohol, or use illegal drugs. Some items may interact with your medicine. What should I watch for while using this medicine? Mild fever and pain should go away in 3 days or less. Report any unusual symptoms to your doctor or health care professional. What side effects may I notice from receiving this medicine? Side effects that you should report to your doctor or health care professional as soon as possible: -allergic reactions like skin rash, itching or hives, swelling of the face, lips, or tongue -breathing problems -confused -fever over 102 degrees F -pain, tingling, numbness in the hands or feet -seizures -unusual bleeding or bruising -unusual muscle weakness Side effects that usually do not require medical attention (report to your doctor or health care professional if they continue or are bothersome): -aches and pains -diarrhea -fever of 102 degrees F or less -headache -irritable -loss of appetite -pain, tender at site where injected -trouble sleeping This list may not describe all possible side effects. Call your doctor for medical advice about side effects. You may report side effects to FDA at 1-800-FDA-1088. Where should I keep my  medicine? This does not apply. This vaccine is given in a clinic, pharmacy, doctor's office, or other health care setting and will not be stored at home. NOTE: This sheet is a summary. It may not cover all possible information. If you have questions about this medicine, talk to your doctor, pharmacist, or health care provider.    2016, [employer]vier/Gold Standard. (2007-08-12 14:32:37)

## 2015-11-29 NOTE — Assessment & Plan Note (Signed)
Rechecking U/A today 

## 2015-11-29 NOTE — Assessment & Plan Note (Signed)
On evista. Due for DEXA next year

## 2015-11-29 NOTE — Progress Notes (Signed)
BP 133/73 (BP Location: Left Arm, Patient Position: Sitting, Cuff Size: Normal)   Pulse 71   Temp 98 F (36.7 C)   Ht 4' 11.4" (1.509 m)   Wt 124 lb 11.2 oz (56.6 kg)   SpO2 99%   BMI 24.85 kg/m    Subjective:    Patient ID: Chelsea Hughes, female    DOB: 29-Jun-1950, 65 y.o.   MRN: [id number]  HPI: Chelsea Hughes is a 65 y.o. female presenting on 11/29/2015 for comprehensive medical examination. Current medical complaints include:none  She currently lives with: alone Menopausal Symptoms: no  Functional Status Survey: Is the patient deaf or have difficulty hearing?: No Does the patient have difficulty seeing, even when wearing glasses/contacts?: No Does the patient have difficulty concentrating, remembering, or making decisions?: No Does the patient have difficulty walking or climbing stairs?: No Does the patient have difficulty dressing or bathing?: No Does the patient have difficulty doing errands alone such as visiting a doctor's office or shopping?: No  Fall Risk  11/29/2015 11/12/2014  Falls in the past year? No No    Depression Screen Depression screen [facility] 2/9 11/29/2015 11/12/2014  Decreased Interest 0 0  Down, Depressed, Hopeless 0 0  PHQ - 2 Score 0 0   Advanced Directives Does patient have a HCPOA?    no Does patient have a living will or MOST form?  no  Past Medical History:  Past Medical History:  Diagnosis Date  . Atypical squamous cell changes of undetermined significance (ASCUS) on cervical cytology with positive high risk human papilloma virus (HPV)   . Diverticulosis    on Colonoscopy 2007; CT scan 2014  . Microscopic hematuria    evaluated by Urology in 2014, abd/pelvis CT scan Dec 2014  . Osteoporosis     Surgical History:  History reviewed. No pertinent surgical history.  Medications:  Current Outpatient Prescriptions on File Prior to Visit  Medication Sig  . aspirin EC 81 MG tablet Take 81 mg by mouth daily.  . Calcium Carbonate  (CALCIUM 600 PO) Take 600 mg by mouth 2 (two) times daily.  . Flaxseed, Linseed, (FLAXSEED OIL) 1000 MG CAPS Take 1,000 mg by mouth daily.  Marland Kitchen loratadine (CLARITIN) 10 MG tablet Take 10 mg by mouth daily.  . Multiple Vitamin (MULTIVITAMIN) tablet Take 1 tablet by mouth daily.  . Omega-3 Fatty Acids (FISH OIL) 1200 MG CAPS Take 1,200 mg by mouth daily.   No current facility-administered medications on file prior to visit.     Allergies:  No Known Allergies  Social History:  Social History   Social History  . Marital status: Married    Spouse name: N/A  . Number of children: N/A  . Years of education: N/A   Occupational History  . Not on file.   Social History Main Topics  . Smoking status: Never Smoker  . Smokeless tobacco: Never Used  . Alcohol use Yes     Comment: wine  . Drug use: No  . Sexual activity: Not on file   Other Topics Concern  . Not on file   Social History Narrative  . No narrative on file   History  Smoking Status  . Never Smoker  Smokeless Tobacco  . Never Used   History  Alcohol Use  . Yes    Comment: wine    Family History:  Family History  Problem Relation Age of Onset  . Heart disease Mother     CABG  .  Hypertension Mother   . Osteoporosis Mother   . Thyroid disease Mother   . Heart disease Father   . Hypertension Father   . Cancer Father     throat (smoker)  . COPD Neg Hx   . Diabetes Neg Hx   . Stroke Neg Hx   . Breast cancer Neg Hx     Past medical history, surgical history, medications, allergies, family history and social history reviewed with patient today and changes made to appropriate areas of the chart.   Review of Systems  Constitutional: Negative.   HENT: Negative.   Eyes: Negative.   Respiratory: Negative.   Cardiovascular: Negative.   Gastrointestinal: Negative.   Genitourinary: Negative.   Musculoskeletal: Negative.   Skin: Negative.   Neurological: Negative.   Endo/Heme/Allergies: Negative for  environmental allergies and polydipsia. Bruises/bleeds easily.  Psychiatric/Behavioral: Negative.     All other ROS negative except what is listed above and in the HPI.      Objective:    BP 133/73 (BP Location: Left Arm, Patient Position: Sitting, Cuff Size: Normal)   Pulse 71   Temp 98 F (36.7 C)   Ht 4' 11.4" (1.509 m)   Wt 124 lb 11.2 oz (56.6 kg)   SpO2 99%   BMI 24.85 kg/m   Wt Readings from Last 3 Encounters:  11/29/15 124 lb 11.2 oz (56.6 kg)  11/12/14 125 lb (56.7 kg)  01/01/14 121 lb (54.9 kg)     Hearing Screening   125Hz  250Hz  500Hz  1000Hz  2000Hz  3000Hz  4000Hz  6000Hz  8000Hz   Right ear:   20 20 20  20     Left ear:   Fail 20 20  20       Visual Acuity Screening   Right eye Left eye Both eyes  Without correction: 20/25 20/30 20/20   With correction:       Physical Exam  Constitutional: She is oriented to person, place, and time. She appears well-developed and well-nourished. No distress.  HENT:  Head: Normocephalic and atraumatic.  Right Ear: Hearing, tympanic membrane, external ear and ear canal normal.  Left Ear: Hearing, tympanic membrane, external ear and ear canal normal.  Nose: Nose normal.  Mouth/Throat: Uvula is midline, oropharynx is clear and moist and mucous membranes are normal. No oropharyngeal exudate.  Eyes: Conjunctivae, EOM and lids are normal. Pupils are equal, round, and reactive to light. Right eye exhibits no discharge. Left eye exhibits no discharge. No scleral icterus.  Neck: Normal range of motion. Neck supple. No JVD present. No tracheal deviation present. No thyromegaly present.  Cardiovascular: Normal rate, regular rhythm, normal heart sounds and intact distal pulses.  Exam reveals no gallop and no friction rub.   No murmur heard. Pulmonary/Chest: Effort normal and breath sounds normal. No stridor. No respiratory distress. She has no wheezes. She has no rales. She exhibits no tenderness.  Abdominal: Soft. Bowel sounds are normal. She  exhibits no distension and no mass. There is no tenderness. There is no rebound and no guarding.  Genitourinary:  Genitourinary Comments: Breast and GYN exams deferred with shared decision making  Musculoskeletal: Normal range of motion. She exhibits no edema, tenderness or deformity.  Lymphadenopathy:    She has no cervical adenopathy.  Neurological: She is alert and oriented to person, place, and time. She has normal reflexes. She displays normal reflexes. No cranial nerve deficit. She exhibits normal muscle tone. Coordination normal.  Skin: Skin is warm, dry and intact. No rash noted. She is not diaphoretic. No erythema.  No pallor.  Psychiatric: She has a normal mood and affect. Her speech is normal and behavior is normal. Judgment and thought content normal. Cognition and memory are normal.  Nursing note and vitals reviewed.   6CIT Screen 11/29/2015  What Year? 0 points  What month? 0 points  What time? 0 points  Count back from 20 0 points  Months in reverse 0 points  Repeat phrase 4 points  Total Score 4    Results for orders placed or performed in visit on 01/10/15  Cologuard  Result Value Ref Range   Cologuard Negative       Assessment & Plan:   Problem List Items Addressed This Visit      Musculoskeletal and Integument   Osteopenia    On evista. Due for DEXA next year      Relevant Orders   TSH   VITAMIN D 25 Hydroxy (Vit-D Deficiency, Fractures)     Genitourinary   Microscopic hematuria    Rechecking UA today.      Relevant Orders   UA/M w/rflx Culture, Routine    Other Visit Diagnoses    Encounter for Medicare annual wellness exam    -  Primary   Immunizations updated. Screening labs checked today. Mammogram due in December. Pap N/A. Colonoscopy up to date. Continue diet and exercise.    Relevant Orders   CBC with Differential/Platelet   Comprehensive metabolic panel   Lipid Panel w/o Chol/HDL Ratio   TSH   UA/M w/rflx Culture, Routine   VITAMIN D  25 Hydroxy (Vit-D Deficiency, Fractures)   Screening for breast cancer       Mammogram ordered today.   Relevant Orders   MM DIGITAL SCREENING BILATERAL   Immunization due       Pneumovax given today.   Need for hepatitis C screening test       Lab drawn today.    Relevant Orders   Hepatitis C Antibody       Preventative Services:  Health Risk Assessment and Personalized Prevention Plan: Done today Bone Mass Measurements: Due next year Breast Cancer Screening: Ordered today CVD Screening: Done today Cervical Cancer Screening: N/A Colon Cancer Screening: up to date Depression Screening: done today Diabetes Screening: Done today Glaucoma Screening: See your eye doctor Hepatitis B vaccine: n/a Hepatitis C screening: done today HIV Screening: N/A Flu Vaccine: up to date Lung cancer Screening: N/A Obesity Screening: done today Pneumonia Vaccines (2): completed today STI Screening: N/A  Follow up plan: Return in about 1 year (around 11/28/2016) for Wellness.   LABORATORY TESTING:  - Pap smear: not applicable  IMMUNIZATIONS:   - Tdap: Tetanus vaccination status reviewed: last tetanus booster within 10 years. - Influenza: Up to date - Pneumovax: Administered today - Prevnar: Up to date - Zostavax vaccine: Up to date  SCREENING: -Mammogram: Ordered today  - Colonoscopy: Up to date  - Bone Density: Up to date   PATIENT COUNSELING:   Advised to take 1 mg of folate supplement per day if capable of pregnancy.   Sexuality: Discussed sexually transmitted diseases, partner selection, use of condoms, avoidance of unintended pregnancy  and contraceptive alternatives.   Advised to avoid cigarette smoking.  I discussed with the patient that most people either abstain from alcohol or drink within safe limits (<=14/week and <=4 drinks/occasion for males, <=7/weeks and <= 3 drinks/occasion for females) and that the risk for alcohol disorders and other health effects rises  proportionally with the number of drinks per  week and how often a drinker exceeds daily limits.  Discussed cessation/primary prevention of drug use and availability of treatment for abuse.   Diet: Encouraged to adjust caloric intake to maintain  or achieve ideal body weight, to reduce intake of dietary saturated fat and total fat, to limit sodium intake by avoiding high sodium foods and not adding table salt, and to maintain adequate dietary potassium and calcium preferably from fresh fruits, vegetables, and low-fat dairy products.    stressed the importance of regular exercise  Injury prevention: Discussed safety belts, safety helmets, smoke detector, smoking near bedding or upholstery.   Dental health: Discussed importance of regular tooth brushing, flossing, and dental visits.    NEXT PREVENTATIVE PHYSICAL DUE IN 1 YEAR. Return in about 1 year (around 11/28/2016) for Wellness.

## 2015-11-30 LAB — COMPREHENSIVE METABOLIC PANEL
ALBUMIN: 4.6 g/dL (ref 3.6–4.8)
ALK PHOS: 65 IU/L (ref 39–117)
ALT: 18 IU/L (ref 0–32)
AST: 19 IU/L (ref 0–40)
Albumin/Globulin Ratio: 1.9 (ref 1.2–2.2)
BILIRUBIN TOTAL: 0.4 mg/dL (ref 0.0–1.2)
BUN / CREAT RATIO: 18 (ref 12–28)
BUN: 16 mg/dL (ref 8–27)
CHLORIDE: 98 mmol/L (ref 96–106)
CO2: 24 mmol/L (ref 18–29)
CREATININE: 0.9 mg/dL (ref 0.57–1.00)
Calcium: 10.1 mg/dL (ref 8.7–10.3)
GFR calc non Af Amer: 67 mL/min/{1.73_m2} (ref >59)
GFR, EST AFRICAN AMERICAN: 77 mL/min/{1.73_m2} (ref >59)
GLUCOSE: 121 mg/dL — AB (ref 65–99)
Globulin, Total: 2.4 g/dL (ref 1.5–4.5)
Potassium: 4.7 mmol/L (ref 3.5–5.2)
Sodium: 142 mmol/L (ref 134–144)
TOTAL PROTEIN: 7 g/dL (ref 6.0–8.5)

## 2015-11-30 LAB — CBC WITH DIFFERENTIAL/PLATELET
BASOS ABS: 0.1 10*3/uL (ref 0.0–0.2)
Basos: 1 %
EOS (ABSOLUTE): 0.1 10*3/uL (ref 0.0–0.4)
Eos: 2 %
HEMOGLOBIN: 14 g/dL (ref 11.1–15.9)
Hematocrit: 41.5 % (ref 34.0–46.6)
IMMATURE GRANS (ABS): 0 10*3/uL (ref 0.0–0.1)
IMMATURE GRANULOCYTES: 0 %
LYMPHS: 21 %
Lymphocytes Absolute: 2 10*3/uL (ref 0.7–3.1)
MCH: 30.2 pg (ref 26.6–33.0)
MCHC: 33.7 g/dL (ref 31.5–35.7)
MCV: 89 fL (ref 79–97)
MONOCYTES: 8 %
Monocytes Absolute: 0.8 10*3/uL (ref 0.1–0.9)
NEUTROS PCT: 68 %
Neutrophils Absolute: 6.4 10*3/uL (ref 1.4–7.0)
PLATELETS: 340 10*3/uL (ref 150–379)
RBC: 4.64 x10E6/uL (ref 3.77–5.28)
RDW: 13.6 % (ref 12.3–15.4)
WBC: 9.3 10*3/uL (ref 3.4–10.8)

## 2015-11-30 LAB — LIPID PANEL W/O CHOL/HDL RATIO
CHOLESTEROL TOTAL: 214 mg/dL — AB (ref 100–199)
HDL: 60 mg/dL (ref >39)
LDL CALC: 131 mg/dL — AB (ref 0–99)
Triglycerides: 116 mg/dL (ref 0–149)
VLDL CHOLESTEROL CAL: 23 mg/dL (ref 5–40)

## 2015-11-30 LAB — HEPATITIS C ANTIBODY: HEP C VIRUS AB: 0.1 {s_co_ratio} (ref 0.0–0.9)

## 2015-11-30 LAB — VITAMIN D 25 HYDROXY (VIT D DEFICIENCY, FRACTURES): Vit D, 25-Hydroxy: 42 ng/mL (ref 30.0–100.0)

## 2015-11-30 LAB — TSH: TSH: 2.29 u[IU]/mL (ref 0.450–4.500)

## 2015-12-02 ENCOUNTER — Encounter: Payer: Self-pay | Admitting: Family Medicine

## 2016-01-12 IMAGING — MG MM DIGITAL SCREENING BILAT W/ CAD
4 series · 4 of 4 positions shown · non-contrast
Comparison: Previous exam(s).

CLINICAL DATA: Screening.

EXAM:
DIGITAL SCREENING BILATERAL MAMMOGRAM WITH CAD

[R MLO]
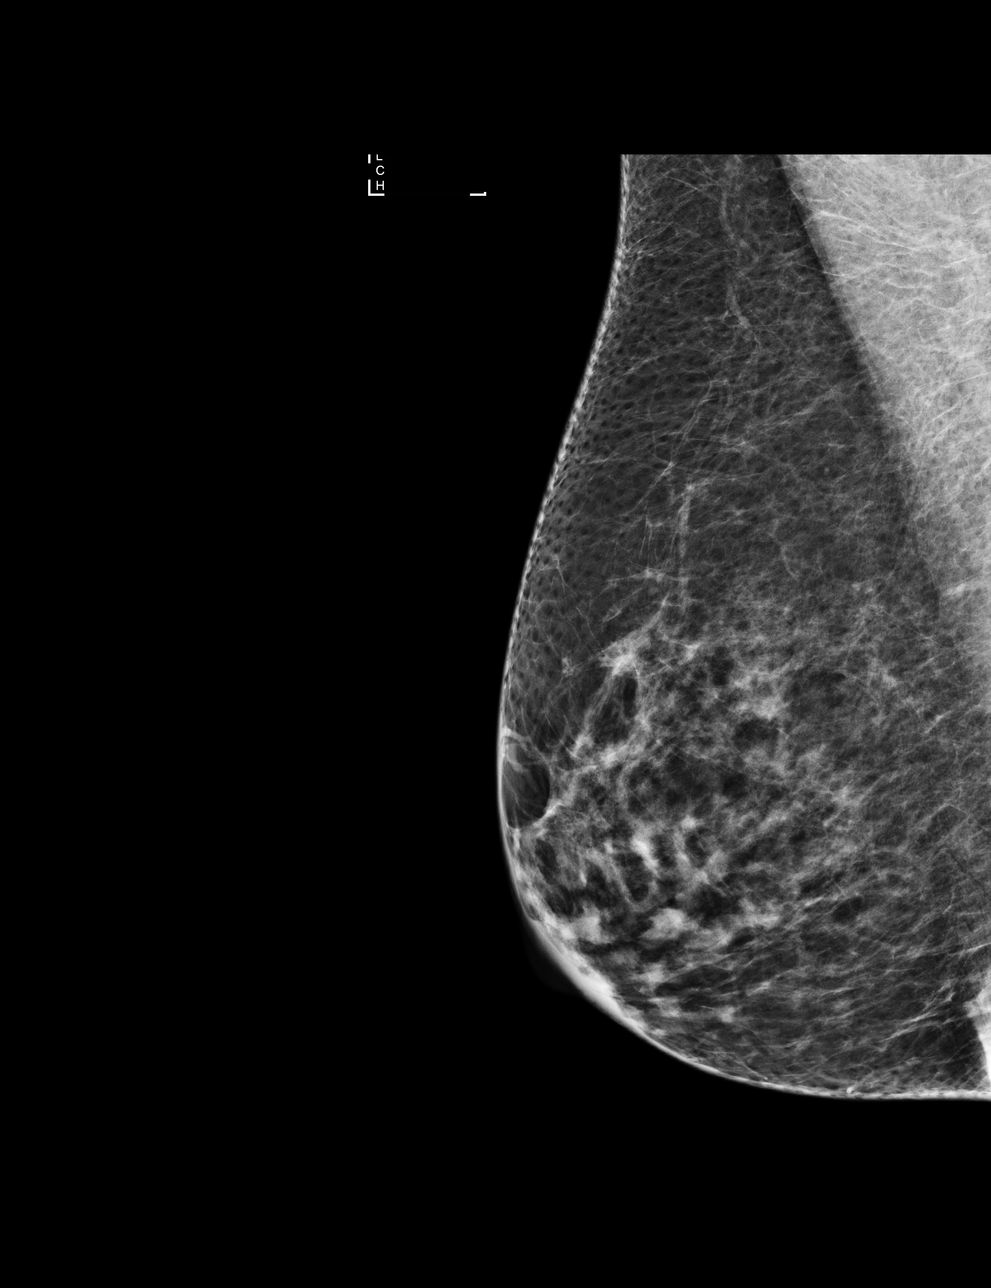

[L CC]
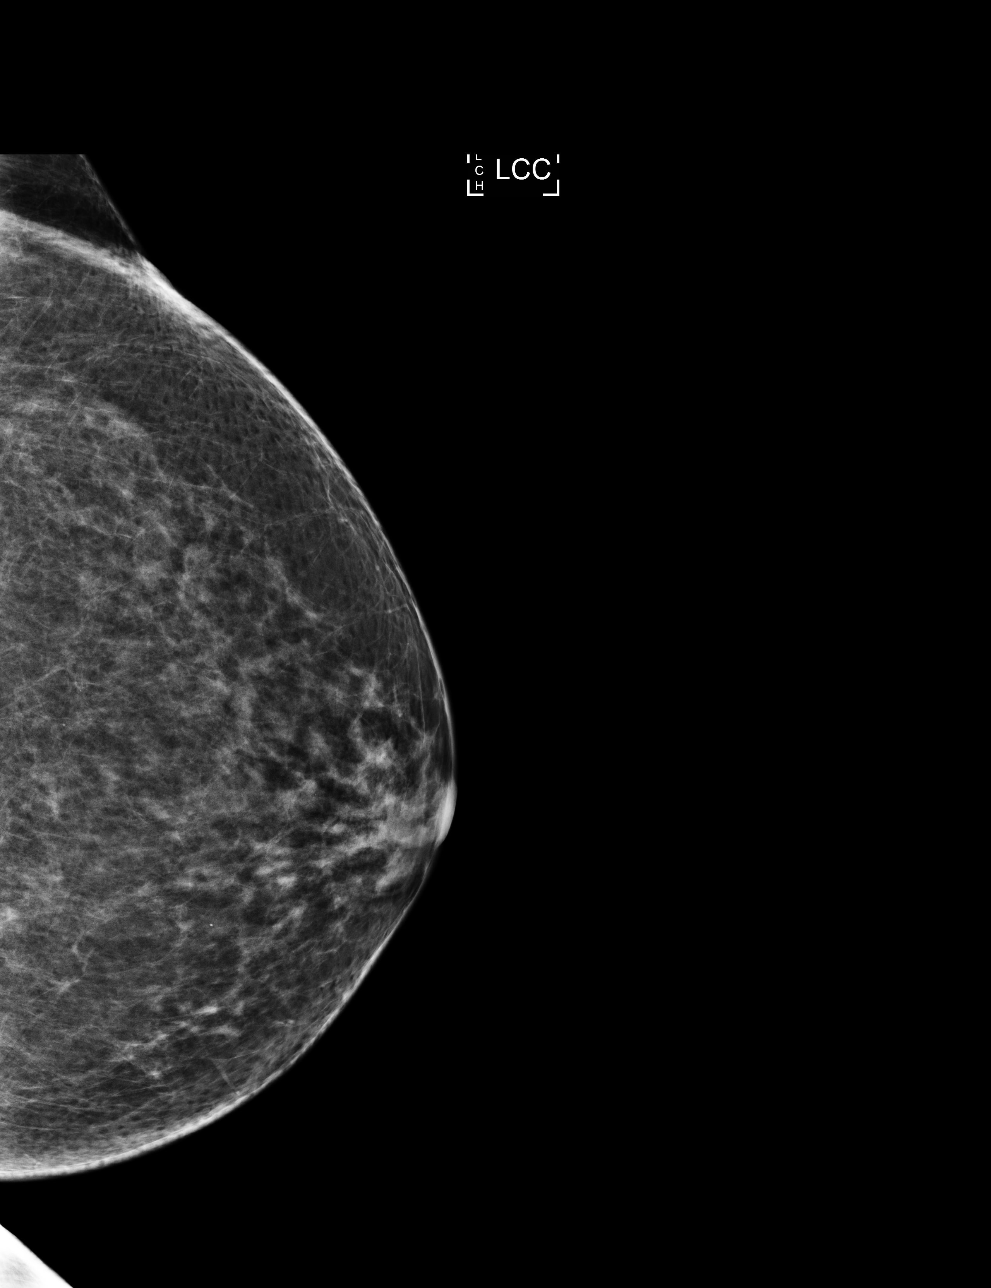

[L MLO]
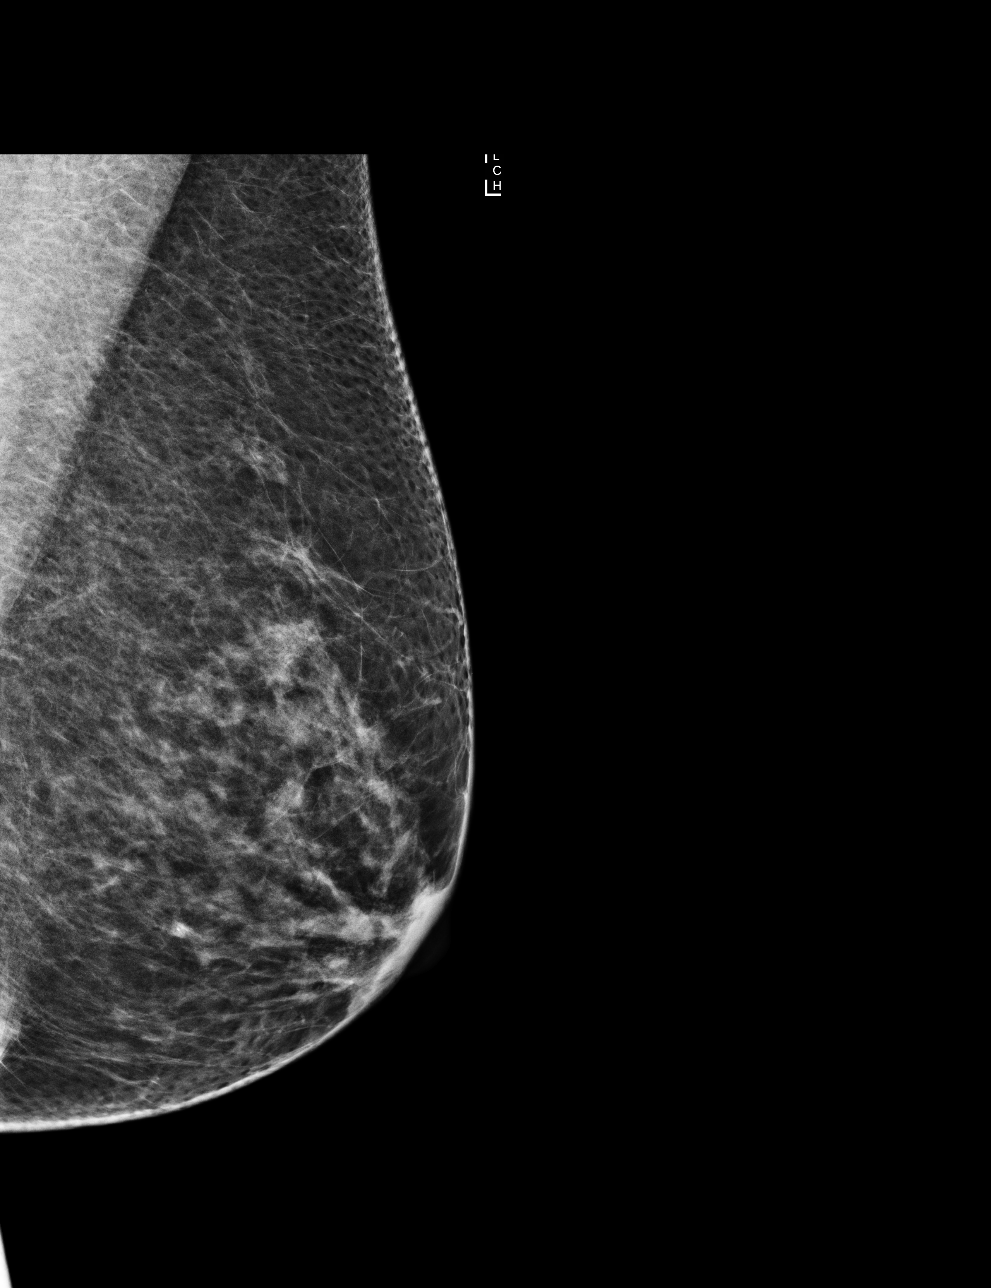

[R CC]
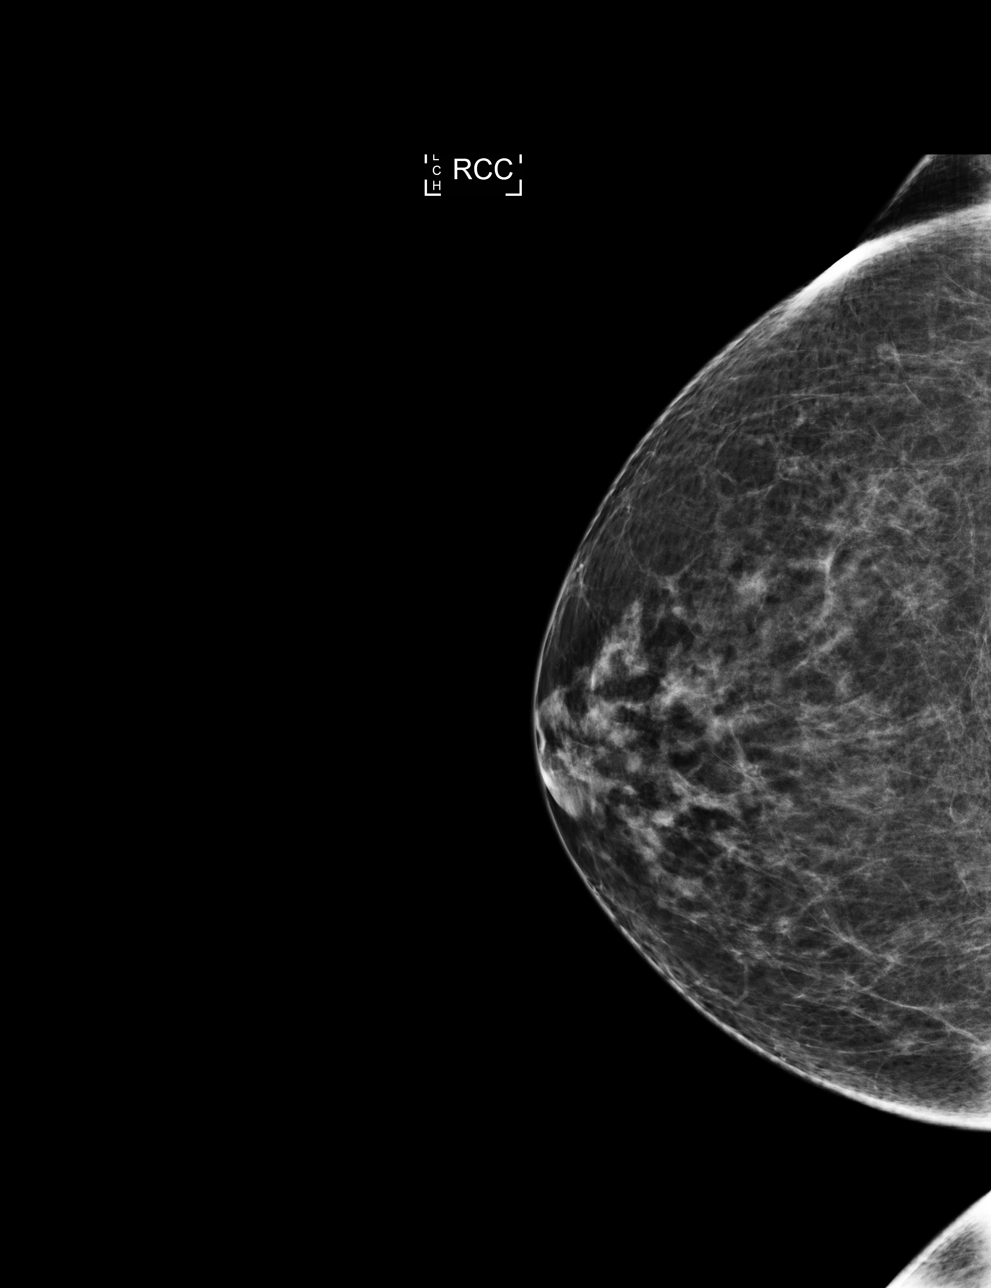

[4 of 4 positions shown; findings below may reference images not displayed]

ACR Breast Density Category b: There are scattered areas of
fibroglandular density.
FINDINGS: There are no findings suspicious for malignancy. Images were
processed with CAD.
IMPRESSION: No mammographic evidence of malignancy. A result letter of this
screening mammogram will be mailed directly to the patient.

RECOMMENDATION:
Screening mammogram in one year. (Code:AS-G-LCT)

BI-RADS CATEGORY  1: Negative.

## 2016-01-27 ENCOUNTER — Ambulatory Visit
Admission: RE | Admit: 2016-01-27 | Discharge: 2016-01-27 | Disposition: A | Discharge status: Home / Self Care | Payer: Medicare Other | Source: Ambulatory Visit | Attending: Family Medicine | Admitting: Family Medicine

## 2016-01-27 ENCOUNTER — Encounter: Payer: Self-pay | Admitting: Family Medicine

## 2016-01-27 DIAGNOSIS — Z1231 Encounter for screening mammogram for malignant neoplasm of breast: Secondary | ICD-10-CM | POA: Insufficient documentation

## 2016-01-27 DIAGNOSIS — Z1239 Encounter for other screening for malignant neoplasm of breast: Secondary | ICD-10-CM

## 2016-11-30 ENCOUNTER — Encounter: Payer: Self-pay | Admitting: Family Medicine

## 2016-11-30 ENCOUNTER — Ambulatory Visit (INDEPENDENT_AMBULATORY_CARE_PROVIDER_SITE_OTHER): Payer: Medicare Other | Admitting: Family Medicine

## 2016-11-30 VITALS — BP 126/62 | HR 71 | Temp 98.2°F | Ht 60.1 in | Wt 125.1 lb

## 2016-11-30 DIAGNOSIS — Z7189 Other specified counseling: Secondary | ICD-10-CM

## 2016-11-30 DIAGNOSIS — T2101XA Burn of unspecified degree of chest wall, initial encounter: Secondary | ICD-10-CM | POA: Diagnosis not present

## 2016-11-30 DIAGNOSIS — K5730 Diverticulosis of large intestine without perforation or abscess without bleeding: Secondary | ICD-10-CM | POA: Diagnosis not present

## 2016-11-30 DIAGNOSIS — M8580 Other specified disorders of bone density and structure, unspecified site: Secondary | ICD-10-CM | POA: Diagnosis not present

## 2016-11-30 DIAGNOSIS — Z1239 Encounter for other screening for malignant neoplasm of breast: Secondary | ICD-10-CM

## 2016-11-30 DIAGNOSIS — Z0000 Encounter for general adult medical examination without abnormal findings: Secondary | ICD-10-CM

## 2016-11-30 DIAGNOSIS — E785 Hyperlipidemia, unspecified: Secondary | ICD-10-CM | POA: Insufficient documentation

## 2016-11-30 DIAGNOSIS — E782 Mixed hyperlipidemia: Secondary | ICD-10-CM | POA: Diagnosis not present

## 2016-11-30 DIAGNOSIS — R3129 Other microscopic hematuria: Secondary | ICD-10-CM

## 2016-11-30 DIAGNOSIS — Z0001 Encounter for general adult medical examination with abnormal findings: Secondary | ICD-10-CM | POA: Diagnosis not present

## 2016-11-30 DIAGNOSIS — T300 Burn of unspecified body region, unspecified degree: Secondary | ICD-10-CM

## 2016-11-30 LAB — UA/M W/RFLX CULTURE, ROUTINE
Bilirubin, UA: NEGATIVE
GLUCOSE, UA: NEGATIVE
Ketones, UA: NEGATIVE
Leukocytes, UA: NEGATIVE
NITRITE UA: NEGATIVE
PH UA: 6 (ref 5.0–7.5)
Protein, UA: NEGATIVE
Specific Gravity, UA: <1.005 — ABNORMAL LOW (ref 1.005–1.030)
UUROB: 0.2 mg/dL (ref 0.2–1.0)

## 2016-11-30 LAB — MICROSCOPIC EXAMINATION: BACTERIA UA: NONE SEEN

## 2016-11-30 MED ORDER — RALOXIFENE HCL 60 MG PO TABS: ORAL_TABLET | ORAL | 4 refills | Status: DC

## 2016-11-30 MED ORDER — SILVER NITRATE 10 % EX OINT: 1.0000 "application " | TOPICAL_OINTMENT | Freq: Every day | CUTANEOUS | 1 refills | Status: DC

## 2016-11-30 MED ORDER — SILVER SULFADIAZINE 1 % EX CREA: 1.0000 "application " | TOPICAL_CREAM | Freq: Every day | CUTANEOUS | 1 refills | Status: DC

## 2016-11-30 NOTE — Assessment & Plan Note (Signed)
On Evista, due for repeat DEXA. Ordered today with her mammogram

## 2016-11-30 NOTE — Assessment & Plan Note (Signed)
Rechecking levels today, not high enough to treat. Call with any concerns. Continue fish oil.

## 2016-11-30 NOTE — Assessment & Plan Note (Signed)
A voluntary discussion about advance care planning including the explanation and discussion of advance directives was extensively discussed  with the patient.  Explanation about the health care proxy and Living will was reviewed and packet with forms with explanation of how to fill them out was given.  During this discussion, the patient was not able to identify a health care proxy and plans to fill out the paperwork required.  Patient was offered a separate Advance Care Planning visit for further assistance with forms.    

## 2016-11-30 NOTE — Progress Notes (Signed)
Blood pressure 126/62, pulse 71, temperature 98.2 F (36.8 C), height 5' 0.1" (1.527 m), weight 125 lb 1 oz (56.7 kg), SpO2 99 %.   Subjective:    Patient ID: Chelsea Hughes, female    DOB: 09/02/1950, 66 y.o.   MRN: [id number]  HPI: Chelsea Hughes is a 66 y.o. female presenting on 11/30/2016 for comprehensive medical examination. Current medical complaints include:none  She currently lives with: husband Menopausal Symptoms: no  Functional Status Survey: Is the patient deaf or have difficulty hearing?: No Does the patient have difficulty seeing, even when wearing glasses/contacts?: No Does the patient have difficulty concentrating, remembering, or making decisions?: No Does the patient have difficulty walking or climbing stairs?: No Does the patient have difficulty dressing or bathing?: No Does the patient have difficulty doing errands alone such as visiting a doctor's office or shopping?: No  Fall Risk  11/29/2015 11/12/2014  Falls in the past year? No No    Depression Screen Depression screen [facility] 2/9 11/29/2015 11/12/2014  Decreased Interest 0 0  Down, Depressed, Hopeless 0 0  PHQ - 2 Score 0 0    Advanced Directives Does patient have a HCPOA?    no Does patient have a living will or MOST form?  no  Past Medical History:  Past Medical History:  Diagnosis Date  . Atypical squamous cell changes of undetermined significance (ASCUS) on cervical cytology with positive high risk human papilloma virus (HPV)   . Diverticulosis    on Colonoscopy 2007; CT scan 2014  . Microscopic hematuria    evaluated by Urology in 2014, abd/pelvis CT scan Dec 2014  . Osteoporosis     Surgical History:  History reviewed. No pertinent surgical history.  Medications:  Current Outpatient Medications on File Prior to Visit  Medication Sig  . Biotin 65784 MCG TABS Take by mouth.  Marland Kitchen aspirin EC 81 MG tablet Take 81 mg by mouth daily.  . Calcium Carbonate (CALCIUM 600 PO) Take 600 mg by  mouth 2 (two) times daily.  . Flaxseed, Linseed, (FLAXSEED OIL) 1000 MG CAPS Take 1,000 mg by mouth daily.  Marland Kitchen loratadine (CLARITIN) 10 MG tablet Take 10 mg by mouth daily.  . Multiple Vitamin (MULTIVITAMIN) tablet Take 1 tablet by mouth daily.  . Omega-3 Fatty Acids (FISH OIL) 1200 MG CAPS Take 1,200 mg by mouth daily.  . raloxifene (EVISTA) 60 MG tablet TAKE ONE (1) TABLET BY MOUTH EVERY DAY   No current facility-administered medications on file prior to visit.     Allergies:  No Known Allergies  Social History:  Social History   Socioeconomic History  . Marital status: Widowed    Spouse name: Not on file  . Number of children: Not on file  . Years of education: Not on file  . Highest education level: Not on file  Social Needs  . Financial resource strain: Not on file  . Food insecurity - worry: Not on file  . Food insecurity - inability: Not on file  . Transportation needs - medical: Not on file  . Transportation needs - non-medical: Not on file  Occupational History  . Not on file  Tobacco Use  . Smoking status: Never Smoker  . Smokeless tobacco: Never Used  Substance and Sexual Activity  . Alcohol use: Yes    Comment: wine  . Drug use: No  . Sexual activity: Not Currently  Other Topics Concern  . Not on file  Social History Narrative  . Not on  file   Social History   Tobacco Use  Smoking Status Never Smoker  Smokeless Tobacco Never Used   Social History   Substance and Sexual Activity  Alcohol Use Yes   Comment: wine    Family History:  Family History  Problem Relation Age of Onset  . Heart disease Mother        CABG  . Hypertension Mother   . Osteoporosis Mother   . Thyroid disease Mother   . Heart disease Father   . Hypertension Father   . Cancer Father        throat (smoker)  . COPD Neg Hx   . Diabetes Neg Hx   . Stroke Neg Hx   . Breast cancer Neg Hx     Past medical history, surgical history, medications, allergies, family history and  social history reviewed with patient today and changes made to appropriate areas of the chart.   Review of Systems  Constitutional: Negative.   HENT: Negative.   Eyes: Negative.   Respiratory: Negative.   Cardiovascular: Negative.   Gastrointestinal: Negative.   Genitourinary: Negative.   Musculoskeletal: Negative.   Skin: Negative.   Neurological: Negative.   Endo/Heme/Allergies: Positive for environmental allergies. Negative for polydipsia. Does not bruise/bleed easily.  Psychiatric/Behavioral: Negative.     All other ROS negative except what is listed above and in the HPI.      Objective:    Blood pressure 126/62, pulse 71, temperature 98.2 F (36.8 C), height 5' 0.1" (1.527 m), weight 125 lb 1 oz (56.7 kg), SpO2 99 %.  Wt Readings from Last 3 Encounters:  11/30/16 125 lb 1 oz (56.7 kg)  11/29/15 124 lb 11.2 oz (56.6 kg)  11/12/14 125 lb (56.7 kg)    Physical Exam  Constitutional: She is oriented to person, place, and time. No distress.  HENT:  Head: Normocephalic and atraumatic.  Right Ear: External ear normal.  Left Ear: External ear normal.  Nose: Nose normal.  Mouth/Throat: Oropharynx is clear and moist. No oropharyngeal exudate.  Eyes: Conjunctivae and EOM are normal. Pupils are equal, round, and reactive to light. Right eye exhibits no discharge. Left eye exhibits no discharge. No scleral icterus.  Neck: Normal range of motion. Neck supple. No JVD present. No tracheal deviation present. No thyromegaly present.  Cardiovascular: Normal rate, regular rhythm, normal heart sounds and intact distal pulses. Exam reveals no gallop and no friction rub.  No murmur heard. Pulmonary/Chest: Effort normal and breath sounds normal. No stridor. No respiratory distress. She has no wheezes. She has no rales. She exhibits no tenderness.  Abdominal: Soft. Bowel sounds are normal. She exhibits no distension and no mass. There is no tenderness. There is no rebound and no guarding.    Genitourinary:  Genitourinary Comments: Breast and pelvic exams deferred with shared decision making.   Musculoskeletal: Normal range of motion. She exhibits no edema, tenderness or deformity.  Lymphadenopathy:    She has no cervical adenopathy.  Neurological: She is alert and oriented to person, place, and time. She has normal reflexes. She displays normal reflexes. No cranial nerve deficit. She exhibits normal muscle tone. Gait normal. Coordination normal.  Skin: Skin is warm and dry. No rash noted. She is not diaphoretic. No erythema. No pallor.  Burn under R breast from curling iron- healing well.   Psychiatric: Mood, memory, affect and judgment normal.  Nursing note and vitals reviewed.   6CIT Screen 11/30/2016 11/29/2015  What Year? 0 points 0 points  What  month? 0 points 0 points  What time? 0 points 0 points  Count back from 20 0 points 0 points  Months in reverse - 0 points  Repeat phrase 0 points 4 points  Total Score - 4    Results for orders placed or performed in visit on 11/29/15  Microscopic Examination  Result Value Ref Range   WBC, UA None seen 0 - 5 /hpf   RBC, UA 0-2 0 - 2 /hpf   Epithelial Cells (non renal) 0-10 0 - 10 /hpf   Bacteria, UA None seen None seen/Few  CBC with Differential/Platelet  Result Value Ref Range   WBC 9.3 3.4 - 10.8 x10E3/uL   RBC 4.64 3.77 - 5.28 x10E6/uL   Hemoglobin 14.0 11.1 - 15.9 g/dL   Hematocrit 14.741.5 82.934.0 - 46.6 %   MCV 89 79 - 97 fL   MCH 30.2 26.6 - 33.0 pg   MCHC 33.7 31.5 - 35.7 g/dL   RDW 56.213.6 13.012.3 - 86.515.4 %   Platelets 340 150 - 379 x10E3/uL   Neutrophils 68 Not Estab. %   Lymphs 21 Not Estab. %   Monocytes 8 Not Estab. %   Eos 2 Not Estab. %   Basos 1 Not Estab. %   Neutrophils Absolute 6.4 1.4 - 7.0 x10E3/uL   Lymphocytes Absolute 2.0 0.7 - 3.1 x10E3/uL   Monocytes Absolute 0.8 0.1 - 0.9 x10E3/uL   EOS (ABSOLUTE) 0.1 0.0 - 0.4 x10E3/uL   Basophils Absolute 0.1 0.0 - 0.2 x10E3/uL   Immature Granulocytes 0 Not  Estab. %   Immature Grans (Abs) 0.0 0.0 - 0.1 x10E3/uL  Comprehensive metabolic panel  Result Value Ref Range   Glucose 121 (H) 65 - 99 mg/dL   BUN 16 8 - 27 mg/dL   Creatinine, Ser 7.840.90 0.57 - 1.00 mg/dL   GFR calc non Af Amer 67 >59 mL/min/1.73   GFR calc Af Amer 77 >59 mL/min/1.73   BUN/Creatinine Ratio 18 12 - 28   Sodium 142 134 - 144 mmol/L   Potassium 4.7 3.5 - 5.2 mmol/L   Chloride 98 96 - 106 mmol/L   CO2 24 18 - 29 mmol/L   Calcium 10.1 8.7 - 10.3 mg/dL   Total Protein 7.0 6.0 - 8.5 g/dL   Albumin 4.6 3.6 - 4.8 g/dL   Globulin, Total 2.4 1.5 - 4.5 g/dL   Albumin/Globulin Ratio 1.9 1.2 - 2.2   Bilirubin Total 0.4 0.0 - 1.2 mg/dL   Alkaline Phosphatase 65 39 - 117 IU/L   AST 19 0 - 40 IU/L   ALT 18 0 - 32 IU/L  Lipid Panel w/o Chol/HDL Ratio  Result Value Ref Range   Cholesterol, Total 214 (H) 100 - 199 mg/dL   Triglycerides 696116 0 - 149 mg/dL   HDL 60 >29>39 mg/dL   VLDL Cholesterol Cal 23 5 - 40 mg/dL   LDL Calculated 528131 (H) 0 - 99 mg/dL  TSH  Result Value Ref Range   TSH 2.290 0.450 - 4.500 uIU/mL  UA/M w/rflx Culture, Routine  Result Value Ref Range   Specific Gravity, UA <1.005 (L) 1.005 - 1.030   pH, UA 5.0 5.0 - 7.5   Color, UA Yellow Yellow   Appearance Ur Clear Clear   Leukocytes, UA Negative Negative   Protein, UA Negative Negative/Trace   Glucose, UA Negative Negative   Ketones, UA Negative Negative   RBC, UA 2+ (A) Negative   Bilirubin, UA Negative Negative   Urobilinogen, Ur  0.2 0.2 - 1.0 mg/dL   Nitrite, UA Negative Negative   Microscopic Examination See below:   VITAMIN D 25 Hydroxy (Vit-D Deficiency, Fractures)  Result Value Ref Range   Vit D, 25-Hydroxy 42.0 30.0 - 100.0 ng/mL  Hepatitis C antibody  Result Value Ref Range   Hep C Virus Ab 0.1 0.0 - 0.9 s/co ratio      Assessment & Plan:   Problem List Items Addressed This Visit      Musculoskeletal and Integument   Osteopenia    On Evista, due for repeat DEXA. Ordered today with her  mammogram      Relevant Orders   CBC with Differential/Platelet   TSH   DG Bone Density     Genitourinary   Microscopic hematuria    No issues at this time. Checking labs. Await results. Call with any concerns.       Relevant Orders   CBC with Differential/Platelet   UA/M w/rflx Culture, Routine     Other   Diverticulosis   Relevant Orders   CBC with Differential/Platelet   Hyperlipidemia    Rechecking levels today, not high enough to treat. Call with any concerns. Continue fish oil.       Relevant Orders   Comprehensive metabolic panel   Lipid Panel w/o Chol/HDL Ratio   Advance directive discussed with patient    A voluntary discussion about advance care planning including the explanation and discussion of advance directives was extensively discussed  with the patient.  Explanation about the health care proxy and Living will was reviewed and packet with forms with explanation of how to fill them out was given.  During this discussion, the patient was not able to identify a health care proxy and plans to fill out the paperwork required.  Patient was offered a separate Advance Care Planning visit for further assistance with forms.          Other Visit Diagnoses    Encounter for Medicare annual wellness exam    -  Primary   Preventitive care discussed as below. Continue to monitor.    Screening for breast cancer       Mammogram ordered today.    Relevant Orders   MM DIGITAL SCREENING BILATERAL   Routine general medical examination at a health care facility       Vaccines up to date. Screening labs checked today. Pap N/A. Mammogram and DEXA ordered. Colon cancer screening up to date. Call with any concerns.    Burn       Will treat with silvadine. Rx sent to her pharmacy.      Preventative Services:  Health Risk Assessment and Personalized Prevention Plan: Done today Bone Mass Measurements: Due in December- ordered today Breast Cancer Screening: Due in January- ordered  today CVD Screening: Done today Cervical Cancer Screening: N/a Colon Cancer Screening: Up to date- due next year Depression Screening: Done today Diabetes Screening: Done today Glaucoma Screening: See your eye doctor Hepatitis B vaccine: N/A Hepatitis C screening: up to date HIV Screening: N/A Flu Vaccine: Up to date Lung cancer Screening: N/A Obesity Screening: Done today Pneumonia Vaccines (2): Up to date STI Screening: N/A  Follow up plan: Return in about 1 year (around 11/30/2017) for Wellness.    LABORATORY TESTING:  - Pap smear: not applicable- 3 negative previously 2010, 2008, 2007, no longer candidate for pap  IMMUNIZATIONS:   - Tdap: Tetanus vaccination status reviewed: last tetanus booster within 10 years. - Influenza: Up  to date - Pneumovax: Up to date - Prevnar: Up to date - Zostavax vaccine: Up to date  SCREENING: -Mammogram: Ordered today  - Colonoscopy: Up to date  - Bone Density:  Ordered today   PATIENT COUNSELING:   Advised to take 1 mg of folate supplement per day if capable of pregnancy.   Sexuality: Discussed sexually transmitted diseases, partner selection, use of condoms, avoidance of unintended pregnancy  and contraceptive alternatives.   Advised to avoid cigarette smoking.  I discussed with the patient that most people either abstain from alcohol or drink within safe limits (<=14/week and <=4 drinks/occasion for males, <=7/weeks and <= 3 drinks/occasion for females) and that the risk for alcohol disorders and other health effects rises proportionally with the number of drinks per week and how often a drinker exceeds daily limits.  Discussed cessation/primary prevention of drug use and availability of treatment for abuse.   Diet: Encouraged to adjust caloric intake to maintain  or achieve ideal body weight, to reduce intake of dietary saturated fat and total fat, to limit sodium intake by avoiding high sodium foods and not adding table salt, and  to maintain adequate dietary potassium and calcium preferably from fresh fruits, vegetables, and low-fat dairy products.    stressed the importance of regular exercise  Injury prevention: Discussed safety belts, safety helmets, smoke detector, smoking near bedding or upholstery.   Dental health: Discussed importance of regular tooth brushing, flossing, and dental visits.    NEXT PREVENTATIVE PHYSICAL DUE IN 1 YEAR. Return in about 1 year (around 11/30/2017) for Wellness.

## 2016-11-30 NOTE — Patient Instructions (Addendum)
Preventative Services:  Health Risk Assessment and Personalized Prevention Plan: Done today Bone Mass Measurements: Due in December- ordered today Breast Cancer Screening: Due in January- ordered today CVD Screening: Done today Cervical Cancer Screening: N/a Colon Cancer Screening: Up to date- due next year Depression Screening: Done today Diabetes Screening: Done today Glaucoma Screening: See your eye doctor Hepatitis B vaccine: N/A Hepatitis C screening: up to date HIV Screening: N/A Flu Vaccine: Up to date Lung cancer Screening: N/A Obesity Screening: Done today Pneumonia Vaccines (2): Up to date STI Screening: N/A  Health Maintenance, Female Adopting a healthy lifestyle and getting preventive care can go a long way to promote health and wellness. Talk with your health care provider about what schedule of regular examinations is right for you. This is a good chance for you to check in with your provider about disease prevention and staying healthy. In between checkups, there are plenty of things you can do on your own. Experts have done a lot of research about which lifestyle changes and preventive measures are most likely to keep you healthy. Ask your health care provider for more information. Weight and diet Eat a healthy diet  Be sure to include plenty of vegetables, fruits, low-fat dairy products, and lean protein.  Do not eat a lot of foods high in solid fats, added sugars, or salt.  Get regular exercise. This is one of the most important things you can do for your health. ? Most adults should exercise for at least 150 minutes each week. The exercise should increase your heart rate and make you sweat (moderate-intensity exercise). ? Most adults should also do strengthening exercises at least twice a week. This is in addition to the moderate-intensity exercise.  Maintain a healthy weight  Body mass index (BMI) is a measurement that can be used to identify possible weight  problems. It estimates body fat based on height and weight. Your health care provider can help determine your BMI and help you achieve or maintain a healthy weight.  For females 39 years of age and older: ? A BMI below 18.5 is considered underweight. ? A BMI of 18.5 to 24.9 is normal. ? A BMI of 25 to 29.9 is considered overweight. ? A BMI of 30 and above is considered obese.  Watch levels of cholesterol and blood lipids  You should start having your blood tested for lipids and cholesterol at 68 years of age, then have this test every 5 years.  You may need to have your cholesterol levels checked more often if: ? Your lipid or cholesterol levels are high. ? You are older than 68 years of age. ? You are at high risk for heart disease.  Cancer screening Lung Cancer  Lung cancer screening is recommended for adults 11-24 years old who are at high risk for lung cancer because of a history of smoking.  A yearly low-dose CT scan of the lungs is recommended for people who: ? Currently smoke. ? Have quit within the past 15 years. ? Have at least a 30-pack-year history of smoking. A pack year is smoking an average of one pack of cigarettes a day for 1 year.  Yearly screening should continue until it has been 15 years since you quit.  Yearly screening should stop if you develop a health problem that would prevent you from having lung cancer treatment.  Breast Cancer  Practice breast self-awareness. This means understanding how your breasts normally appear and feel.  It also means doing  regular breast self-exams. Let your health care provider know about any changes, no matter how small.  If you are in your 20s or 30s, you should have a clinical breast exam (CBE) by a health care provider every 1-3 years as part of a regular health exam.  If you are 62 or older, have a CBE every year. Also consider having a breast X-ray (mammogram) every year.  If you have a family history of breast  cancer, talk to your health care provider about genetic screening.  If you are at high risk for breast cancer, talk to your health care provider about having an MRI and a mammogram every year.  Breast cancer gene (BRCA) assessment is recommended for women who have family members with BRCA-related cancers. BRCA-related cancers include: ? Breast. ? Ovarian. ? Tubal. ? Peritoneal cancers.  Results of the assessment will determine the need for genetic counseling and BRCA1 and BRCA2 testing.  Cervical Cancer Your health care provider may recommend that you be screened regularly for cancer of the pelvic organs (ovaries, uterus, and vagina). This screening involves a pelvic examination, including checking for microscopic changes to the surface of your cervix (Pap test). You may be encouraged to have this screening done every 3 years, beginning at age 22.  For women ages 36-65, health care providers may recommend pelvic exams and Pap testing every 3 years, or they may recommend the Pap and pelvic exam, combined with testing for human papilloma virus (HPV), every 5 years. Some types of HPV increase your risk of cervical cancer. Testing for HPV may also be done on women of any age with unclear Pap test results.  Other health care providers may not recommend any screening for nonpregnant women who are considered low risk for pelvic cancer and who do not have symptoms. Ask your health care provider if a screening pelvic exam is right for you.  If you have had past treatment for cervical cancer or a condition that could lead to cancer, you need Pap tests and screening for cancer for at least 20 years after your treatment. If Pap tests have been discontinued, your risk factors (such as having a new sexual partner) need to be reassessed to determine if screening should resume. Some women have medical problems that increase the chance of getting cervical cancer. In these cases, your health care provider may  recommend more frequent screening and Pap tests.  Colorectal Cancer  This type of cancer can be detected and often prevented.  Routine colorectal cancer screening usually begins at 68 years of age and continues through 68 years of age.  Your health care provider may recommend screening at an earlier age if you have risk factors for colon cancer.  Your health care provider may also recommend using home test kits to check for hidden blood in the stool.  A small camera at the end of a tube can be used to examine your colon directly (sigmoidoscopy or colonoscopy). This is done to check for the earliest forms of colorectal cancer.  Routine screening usually begins at age 77.  Direct examination of the colon should be repeated every 5-10 years through 68 years of age. However, you may need to be screened more often if early forms of precancerous polyps or small growths are found.  Skin Cancer  Check your skin from head to toe regularly.  Tell your health care provider about any new moles or changes in moles, especially if there is a change in a mole's  shape or color.  Also tell your health care provider if you have a mole that is larger than the size of a pencil eraser.  Always use sunscreen. Apply sunscreen liberally and repeatedly throughout the day.  Protect yourself by wearing long sleeves, pants, a wide-brimmed hat, and sunglasses whenever you are outside.  Heart disease, diabetes, and high blood pressure  High blood pressure causes heart disease and increases the risk of stroke. High blood pressure is more likely to develop in: ? People who have blood pressure in the high end of the normal range (130-139/85-89 mm Hg). ? People who are overweight or obese. ? People who are African American.  If you are 44-31 years of age, have your blood pressure checked every 3-5 years. If you are 23 years of age or older, have your blood pressure checked every year. You should have your blood  pressure measured twice-once when you are at a hospital or clinic, and once when you are not at a hospital or clinic. Record the average of the two measurements. To check your blood pressure when you are not at a hospital or clinic, you can use: ? An automated blood pressure machine at a pharmacy. ? A home blood pressure monitor.  If you are between 79 years and 61 years old, ask your health care provider if you should take aspirin to prevent strokes.  Have regular diabetes screenings. This involves taking a blood sample to check your fasting blood sugar level. ? If you are at a normal weight and have a low risk for diabetes, have this test once every three years after 68 years of age. ? If you are overweight and have a high risk for diabetes, consider being tested at a younger age or more often. Preventing infection Hepatitis B  If you have a higher risk for hepatitis B, you should be screened for this virus. You are considered at high risk for hepatitis B if: ? You were born in a country where hepatitis B is common. Ask your health care provider which countries are considered high risk. ? Your parents were born in a high-risk country, and you have not been immunized against hepatitis B (hepatitis B vaccine). ? You have HIV or AIDS. ? You use needles to inject street drugs. ? You live with someone who has hepatitis B. ? You have had sex with someone who has hepatitis B. ? You get hemodialysis treatment. ? You take certain medicines for conditions, including cancer, organ transplantation, and autoimmune conditions.  Hepatitis C  Blood testing is recommended for: ? Everyone born from [birth year] through 1967. ? Anyone with known risk factors for hepatitis C.  Sexually transmitted infections (STIs)  You should be screened for sexually transmitted infections (STIs) including gonorrhea and chlamydia if: ? You are sexually active and are younger than 68 years of age. ? You are older than 68 years  of age and your health care provider tells you that you are at risk for this type of infection. ? Your sexual activity has changed since you were last screened and you are at an increased risk for chlamydia or gonorrhea. Ask your health care provider if you are at risk.  If you do not have HIV, but are at risk, it may be recommended that you take a prescription medicine daily to prevent HIV infection. This is called pre-exposure prophylaxis (PrEP). You are considered at risk if: ? You are sexually active and do not regularly use condoms or know  the HIV status of your partner(s). ? You take drugs by injection. ? You are sexually active with a partner who has HIV.  Talk with your health care provider about whether you are at high risk of being infected with HIV. If you choose to begin PrEP, you should first be tested for HIV. You should then be tested every 3 months for as long as you are taking PrEP. Pregnancy  If you are premenopausal and you may become pregnant, ask your health care provider about preconception counseling.  If you may become pregnant, take 400 to 800 micrograms (mcg) of folic acid every day.  If you want to prevent pregnancy, talk to your health care provider about birth control (contraception). Osteoporosis and menopause  Osteoporosis is a disease in which the bones lose minerals and strength with aging. This can result in serious bone fractures. Your risk for osteoporosis can be identified using a bone density scan.  If you are 92 years of age or older, or if you are at risk for osteoporosis and fractures, ask your health care provider if you should be screened.  Ask your health care provider whether you should take a calcium or vitamin D supplement to lower your risk for osteoporosis.  Menopause may have certain physical symptoms and risks.  Hormone replacement therapy may reduce some of these symptoms and risks. Talk to your health care provider about whether hormone  replacement therapy is right for you. Follow these instructions at home:  Schedule regular health, dental, and eye exams.  Stay current with your immunizations.  Do not use any tobacco products including cigarettes, chewing tobacco, or electronic cigarettes.  If you are pregnant, do not drink alcohol.  If you are breastfeeding, limit how much and how often you drink alcohol.  Limit alcohol intake to no more than 1 drink per day for nonpregnant women. One drink equals 12 ounces of beer, 5 ounces of wine, or 1 ounces of hard liquor.  Do not use street drugs.  Do not share needles.  Ask your health care provider for help if you need support or information about quitting drugs.  Tell your health care provider if you often feel depressed.  Tell your health care provider if you have ever been abused or do not feel safe at home. This information is not intended to replace advice given to you by your health care provider. Make sure you discuss any questions you have with your health care provider. Document Released: 07/21/2010 Document Revised: 06/13/2015 Document Reviewed: 10/09/2014 Elsevier Interactive Patient Education  2018 [place] Maintenance for Postmenopausal Women Menopause is a normal process in which your reproductive ability comes to an end. This process happens gradually over a span of months to years, usually between the ages of 75 and 49. Menopause is complete when you have missed 12 consecutive menstrual periods. It is important to talk with your health care provider about some of the most common conditions that affect postmenopausal women, such as heart disease, cancer, and bone loss (osteoporosis). Adopting a healthy lifestyle and getting preventive care can help to promote your health and wellness. Those actions can also lower your chances of developing some of these common conditions. What should I know about menopause? During menopause, you may experience  a number of symptoms, such as:  Moderate-to-severe hot flashes.  Night sweats.  Decrease in sex drive.  Mood swings.  Headaches.  Tiredness.  Irritability.  Memory problems.  Insomnia.  Choosing to treat  or not to treat menopausal changes is an individual decision that you make with your health care provider. What should I know about hormone replacement therapy and supplements? Hormone therapy products are effective for treating symptoms that are associated with menopause, such as hot flashes and night sweats. Hormone replacement carries certain risks, especially as you become older. If you are thinking about using estrogen or estrogen with progestin treatments, discuss the benefits and risks with your health care provider. What should I know about heart disease and stroke? Heart disease, heart attack, and stroke become more likely as you age. This may be due, in part, to the hormonal changes that your body experiences during menopause. These can affect how your body processes dietary fats, triglycerides, and cholesterol. Heart attack and stroke are both medical emergencies. There are many things that you can do to help prevent heart disease and stroke:  Have your blood pressure checked at least every 1-2 years. High blood pressure causes heart disease and increases the risk of stroke.  If you are 8-68 years old, ask your health care provider if you should take aspirin to prevent a heart attack or a stroke.  Do not use any tobacco products, including cigarettes, chewing tobacco, or electronic cigarettes. If you need help quitting, ask your health care provider.  It is important to eat a healthy diet and maintain a healthy weight. ? Be sure to include plenty of vegetables, fruits, low-fat dairy products, and lean protein. ? Avoid eating foods that are high in solid fats, added sugars, or salt (sodium).  Get regular exercise. This is one of the most important things that you can  do for your health. ? Try to exercise for at least 150 minutes each week. The type of exercise that you do should increase your heart rate and make you sweat. This is known as moderate-intensity exercise. ? Try to do strengthening exercises at least twice each week. Do these in addition to the moderate-intensity exercise.  Know your numbers.Ask your health care provider to check your cholesterol and your blood glucose. Continue to have your blood tested as directed by your health care provider.  What should I know about cancer screening? There are several types of cancer. Take the following steps to reduce your risk and to catch any cancer development as early as possible. Breast Cancer  Practice breast self-awareness. ? This means understanding how your breasts normally appear and feel. ? It also means doing regular breast self-exams. Let your health care provider know about any changes, no matter how small.  If you are 39 or older, have a clinician do a breast exam (clinical breast exam or CBE) every year. Depending on your age, family history, and medical history, it may be recommended that you also have a yearly breast X-ray (mammogram).  If you have a family history of breast cancer, talk with your health care provider about genetic screening.  If you are at high risk for breast cancer, talk with your health care provider about having an MRI and a mammogram every year.  Breast cancer (BRCA) gene test is recommended for women who have family members with BRCA-related cancers. Results of the assessment will determine the need for genetic counseling and BRCA1 and for BRCA2 testing. BRCA-related cancers include these types: ? Breast. This occurs in males or females. ? Ovarian. ? Tubal. This may also be called fallopian tube cancer. ? Cancer of the abdominal or pelvic lining (peritoneal cancer). ? Prostate. ? Pancreatic.  Cervical, Uterine, and Ovarian Cancer Your health care provider  may recommend that you be screened regularly for cancer of the pelvic organs. These include your ovaries, uterus, and vagina. This screening involves a pelvic exam, which includes checking for microscopic changes to the surface of your cervix (Pap test).  For women ages 21-65, health care providers may recommend a pelvic exam and a Pap test every three years. For women ages 15-65, they may recommend the Pap test and pelvic exam, combined with testing for human papilloma virus (HPV), every five years. Some types of HPV increase your risk of cervical cancer. Testing for HPV may also be done on women of any age who have unclear Pap test results.  Other health care providers may not recommend any screening for nonpregnant women who are considered low risk for pelvic cancer and have no symptoms. Ask your health care provider if a screening pelvic exam is right for you.  If you have had past treatment for cervical cancer or a condition that could lead to cancer, you need Pap tests and screening for cancer for at least 20 years after your treatment. If Pap tests have been discontinued for you, your risk factors (such as having a new sexual partner) need to be reassessed to determine if you should start having screenings again. Some women have medical problems that increase the chance of getting cervical cancer. In these cases, your health care provider may recommend that you have screening and Pap tests more often.  If you have a family history of uterine cancer or ovarian cancer, talk with your health care provider about genetic screening.  If you have vaginal bleeding after reaching menopause, tell your health care provider.  There are currently no reliable tests available to screen for ovarian cancer.  Lung Cancer Lung cancer screening is recommended for adults 66-51 years old who are at high risk for lung cancer because of a history of smoking. A yearly low-dose CT scan of the lungs is recommended if  you:  Currently smoke.  Have a history of at least 30 pack-years of smoking and you currently smoke or have quit within the past 15 years. A pack-year is smoking an average of one pack of cigarettes per day for one year.  Yearly screening should:  Continue until it has been 15 years since you quit.  Stop if you develop a health problem that would prevent you from having lung cancer treatment.  Colorectal Cancer  This type of cancer can be detected and can often be prevented.  Routine colorectal cancer screening usually begins at age 32 and continues through age 13.  If you have risk factors for colon cancer, your health care provider may recommend that you be screened at an earlier age.  If you have a family history of colorectal cancer, talk with your health care provider about genetic screening.  Your health care provider may also recommend using home test kits to check for hidden blood in your stool.  A small camera at the end of a tube can be used to examine your colon directly (sigmoidoscopy or colonoscopy). This is done to check for the earliest forms of colorectal cancer.  Direct examination of the colon should be repeated every 5-10 years until age 85. However, if early forms of precancerous polyps or small growths are found or if you have a family history or genetic risk for colorectal cancer, you may need to be screened more often.  Skin Cancer  Check your  skin from head to toe regularly.  Monitor any moles. Be sure to tell your health care provider: ? About any new moles or changes in moles, especially if there is a change in a mole's shape or color. ? If you have a mole that is larger than the size of a pencil eraser.  If any of your family members has a history of skin cancer, especially at a young age, talk with your health care provider about genetic screening.  Always use sunscreen. Apply sunscreen liberally and repeatedly throughout the day.  Whenever you are  outside, protect yourself by wearing long sleeves, pants, a wide-brimmed hat, and sunglasses.  What should I know about osteoporosis? Osteoporosis is a condition in which bone destruction happens more quickly than new bone creation. After menopause, you may be at an increased risk for osteoporosis. To help prevent osteoporosis or the bone fractures that can happen because of osteoporosis, the following is recommended:  If you are 18-37 years old, get at least 1,000 mg of calcium and at least 600 mg of vitamin D per day.  If you are older than age 41 but younger than age 61, get at least 1,200 mg of calcium and at least 600 mg of vitamin D per day.  If you are older than age 81, get at least 1,200 mg of calcium and at least 800 mg of vitamin D per day.  Smoking and excessive alcohol intake increase the risk of osteoporosis. Eat foods that are rich in calcium and vitamin D, and do weight-bearing exercises several times each week as directed by your health care provider. What should I know about how menopause affects my mental health? Depression may occur at any age, but it is more common as you become older. Common symptoms of depression include:  Low or sad mood.  Changes in sleep patterns.  Changes in appetite or eating patterns.  Feeling an overall lack of motivation or enjoyment of activities that you previously enjoyed.  Frequent crying spells.  Talk with your health care provider if you think that you are experiencing depression. What should I know about immunizations? It is important that you get and maintain your immunizations. These include:  Tetanus, diphtheria, and pertussis (Tdap) booster vaccine.  Influenza every year before the flu season begins.  Pneumonia vaccine.  Shingles vaccine.  Your health care provider may also recommend other immunizations. This information is not intended to replace advice given to you by your health care provider. Make sure you discuss  any questions you have with your health care provider. Document Released: 02/27/2005 Document Revised: 07/26/2015 Document Reviewed: 10/09/2014 Elsevier Interactive Patient Education  2018 [employer].

## 2016-11-30 NOTE — Assessment & Plan Note (Signed)
No issues at this time. Checking labs. Await results. Call with any concerns.

## 2016-12-01 ENCOUNTER — Encounter: Payer: Self-pay | Admitting: Family Medicine

## 2016-12-01 LAB — LIPID PANEL W/O CHOL/HDL RATIO
CHOLESTEROL TOTAL: 202 mg/dL — AB (ref 100–199)
HDL: 64 mg/dL (ref >39)
LDL CALC: 112 mg/dL — AB (ref 0–99)
TRIGLYCERIDES: 132 mg/dL (ref 0–149)
VLDL CHOLESTEROL CAL: 26 mg/dL (ref 5–40)

## 2016-12-01 LAB — COMPREHENSIVE METABOLIC PANEL
ALT: 12 IU/L (ref 0–32)
AST: 17 IU/L (ref 0–40)
Albumin/Globulin Ratio: 1.7 (ref 1.2–2.2)
Albumin: 4.4 g/dL (ref 3.6–4.8)
Alkaline Phosphatase: 48 IU/L (ref 39–117)
BUN/Creatinine Ratio: 16 (ref 12–28)
BUN: 14 mg/dL (ref 8–27)
Bilirubin Total: 0.2 mg/dL (ref 0.0–1.2)
CO2: 26 mmol/L (ref 20–29)
Calcium: 9.8 mg/dL (ref 8.7–10.3)
Chloride: 100 mmol/L (ref 96–106)
Creatinine, Ser: 0.85 mg/dL (ref 0.57–1.00)
GFR calc Af Amer: 82 mL/min/{1.73_m2} (ref >59)
GFR calc non Af Amer: 71 mL/min/{1.73_m2} (ref >59)
Globulin, Total: 2.6 g/dL (ref 1.5–4.5)
Glucose: 86 mg/dL (ref 65–99)
Potassium: 4.7 mmol/L (ref 3.5–5.2)
Sodium: 143 mmol/L (ref 134–144)
Total Protein: 7 g/dL (ref 6.0–8.5)

## 2016-12-01 LAB — CBC WITH DIFFERENTIAL/PLATELET
Basophils Absolute: 0 10*3/uL (ref 0.0–0.2)
Basos: 0 %
EOS (ABSOLUTE): 0.2 10*3/uL (ref 0.0–0.4)
Eos: 2 %
Hematocrit: 38.8 % (ref 34.0–46.6)
Hemoglobin: 12.6 g/dL (ref 11.1–15.9)
Immature Grans (Abs): 0 10*3/uL (ref 0.0–0.1)
Immature Granulocytes: 0 %
Lymphocytes Absolute: 1.8 10*3/uL (ref 0.7–3.1)
Lymphs: 25 %
MCH: 27.3 pg (ref 26.6–33.0)
MCHC: 32.5 g/dL (ref 31.5–35.7)
MCV: 84 fL (ref 79–97)
Monocytes Absolute: 0.7 10*3/uL (ref 0.1–0.9)
Monocytes: 9 %
Neutrophils Absolute: 4.7 10*3/uL (ref 1.4–7.0)
Neutrophils: 64 %
Platelets: 344 10*3/uL (ref 150–379)
RBC: 4.62 x10E6/uL (ref 3.77–5.28)
RDW: 15.1 % (ref 12.3–15.4)
WBC: 7.3 10*3/uL (ref 3.4–10.8)

## 2016-12-01 LAB — TSH: TSH: 2.89 u[IU]/mL (ref 0.450–4.500)

## 2017-02-25 ENCOUNTER — Ambulatory Visit
Admission: RE | Admit: 2017-02-25 | Discharge: 2017-02-25 | Disposition: A | Discharge status: Home / Self Care | Payer: Medicare Other | Source: Ambulatory Visit | Attending: Family Medicine | Admitting: Family Medicine

## 2017-02-25 ENCOUNTER — Encounter: Payer: Self-pay | Admitting: Family Medicine

## 2017-02-25 ENCOUNTER — Telehealth: Payer: Self-pay | Admitting: Family Medicine

## 2017-02-25 DIAGNOSIS — Z1231 Encounter for screening mammogram for malignant neoplasm of breast: Secondary | ICD-10-CM | POA: Insufficient documentation

## 2017-02-25 DIAGNOSIS — M8580 Other specified disorders of bone density and structure, unspecified site: Secondary | ICD-10-CM | POA: Diagnosis present

## 2017-02-25 DIAGNOSIS — Z1239 Encounter for other screening for malignant neoplasm of breast: Secondary | ICD-10-CM

## 2017-02-25 DIAGNOSIS — M8588 Other specified disorders of bone density and structure, other site: Secondary | ICD-10-CM | POA: Diagnosis not present

## 2017-02-25 NOTE — Telephone Encounter (Signed)
Patient notified

## 2017-02-25 NOTE — Telephone Encounter (Signed)
Please let Chelsea Hughes know that her bone density came back about the same as last time. We'll keep an eye on it, but no need for medicines right now.

## 2017-02-26 ENCOUNTER — Encounter: Payer: Self-pay | Admitting: Family Medicine

## 2017-12-02 ENCOUNTER — Ambulatory Visit (INDEPENDENT_AMBULATORY_CARE_PROVIDER_SITE_OTHER): Payer: Medicare Other

## 2017-12-02 VITALS — BP 132/70 | HR 74 | Temp 98.2°F | Ht 60.0 in | Wt 128.0 lb

## 2017-12-02 DIAGNOSIS — Z0000 Encounter for general adult medical examination without abnormal findings: Secondary | ICD-10-CM | POA: Diagnosis not present

## 2017-12-02 MED ORDER — ZOSTER VAC RECOMB ADJUVANTED 50 MCG/0.5ML IM SUSR: 0.5000 mL | Freq: Once | INTRAMUSCULAR | 1 refills | Status: AC

## 2017-12-02 NOTE — Progress Notes (Signed)
Subjective:   Chelsea Hughes is a 67 y.o. female who presents for Medicare Annual (Subsequent) preventive examination.  Last AWV-11/29/2015    Objective:     Vitals: BP 132/70 (BP Location: Left Arm, Patient Position: Sitting)   Pulse 74   Temp 98.2 F (36.8 C) (Oral)   Ht 5' (1.524 m)   Wt 128 lb (58.1 kg)   SpO2 97%   BMI 25.00 kg/m   Body mass index is 25 kg/m.  Advanced Directives 12/02/2017 11/29/2015  Does Patient Have a Medical Advance Directive? No No  Would patient like information on creating a medical advance directive? Yes (MAU/Ambulatory/Procedural Areas - Information given) Yes - Educational materials given    Tobacco Social History   Tobacco Use  Smoking Status Never Smoker  Smokeless Tobacco Never Used     Counseling given: Not Answered   Clinical Intake:  Pre-visit preparation completed: No  Pain : No/denies pain     Diabetes: No  How often do you need to have someone help you when you read instructions, pamphlets, or other written materials from your doctor or pharmacy?: 1 - Never What is the last grade level you completed in school?: [employer]  Interpreter Needed?: No  Information entered by :: Chelsea Russell, RN  Past Medical History:  Diagnosis Date  . Atypical squamous cell changes of undetermined significance (ASCUS) on cervical cytology with positive high risk human papilloma virus (HPV)   . Diverticulosis    on Colonoscopy 2007; CT scan 2014  . Microscopic hematuria    evaluated by Urology in 2014, abd/pelvis CT scan Dec 2014  . Osteoporosis    History reviewed. No pertinent surgical history. Family History  Problem Relation Age of Onset  . Heart disease Mother        CABG  . Hypertension Mother   . Osteoporosis Mother   . Thyroid disease Mother   . Heart disease Father   . Hypertension Father   . Cancer Father        throat (smoker)  . COPD Neg Hx   . Diabetes Neg Hx   . Stroke Neg Hx   . Breast cancer Neg Hx     Social History   Socioeconomic History  . Marital status: Widowed    Spouse name: Not on file  . Number of children: Not on file  . Years of education: Not on file  . Highest education level: Not on file  Occupational History  . Not on file  Social Needs  . Financial resource strain: Not hard at all  . Food insecurity:    Worry: Never true    Inability: Never true  . Transportation needs:    Medical: No    Non-medical: No  Tobacco Use  . Smoking status: Never Smoker  . Smokeless tobacco: Never Used  Substance and Sexual Activity  . Alcohol use: Yes    Alcohol/week: 7.0 standard drinks    Types: 7 Glasses of wine per week    Comment: wine  . Drug use: No  . Sexual activity: Not Currently  Lifestyle  . Physical activity:    Days per week: 5 days    Minutes per session: 40 min  . Stress: Only a little  Relationships  . Social connections:    Talks on phone: More than three times a week    Gets together: More than three times a week    Attends religious service: More than 4 times per year  Active member of club or organization: Yes    Attends meetings of clubs or organizations: More than 4 times per year    Relationship status: Widowed  Other Topics Concern  . Not on file  Social History Narrative  . Not on file    Outpatient Encounter Medications as of 12/02/2017  Medication Sig  . aspirin EC 81 MG tablet Take 81 mg by mouth daily.  . Biotin 1610910000 MCG TABS Take by mouth.  . Calcium Carbonate (CALCIUM 600 PO) Take 600 mg by mouth 2 (two) times daily.  . Flaxseed, Linseed, (FLAXSEED OIL) 1000 MG CAPS Take 1,000 mg by mouth daily.  Marland Kitchen. loratadine (CLARITIN) 10 MG tablet Take 10 mg by mouth daily.  . Multiple Vitamin (MULTIVITAMIN) tablet Take 1 tablet by mouth daily.  . Omega-3 Fatty Acids (FISH OIL) 1200 MG CAPS Take 1,200 mg by mouth daily.  . raloxifene (EVISTA) 60 MG tablet TAKE ONE (1) TABLET BY MOUTH EVERY DAY  . Zoster Vaccine Adjuvanted [facility](SHINGRIX)  injection Inject 0.5 mLs into the muscle once for 1 dose.  . [DISCONTINUED] Zoster Vaccine Adjuvanted [facility](SHINGRIX) injection Inject 0.5 mLs into the muscle once.  . silver sulfADIAZINE (SILVADENE) 1 % cream Apply 1 application daily topically. To burn   No facility-administered encounter medications on file as of 12/02/2017.     Activities of Daily Living In your present state of health, do you have any difficulty performing the following activities: 12/02/2017  Hearing? N  Vision? N  Difficulty concentrating or making decisions? N  Walking or climbing stairs? N  Dressing or bathing? N  Doing errands, shopping? N  Preparing Food and eating ? N  Using the Toilet? N  In the past six months, have you accidently leaked urine? N  Do you have problems with loss of bowel control? N  Managing your Medications? N  Managing your Finances? N  Housekeeping or managing your Housekeeping? N  Some recent data might be hidden    Patient Care Team: Dorcas CarrowJohnson, Megan P, DO as PCP - General (Family Medicine) Nancy MarusKleinman, Dawn E, MD (Dermatology)    Assessment:   This is a routine wellness examination for [name].  Exercise Activities and Dietary recommendations Current Exercise Habits: Home exercise routine, Type of exercise: walking, Time (Minutes): 45, Frequency (Times/Week): 5, Weekly Exercise (Minutes/Week): 225, Intensity: Mild, Exercise limited by: None identified  Goals   None     Fall Risk Fall Risk  12/02/2017 11/29/2015 11/12/2014  Falls in the past year? 0 No No  Number falls in past yr: 0 - -  Injury with Fall? 0 - -   Is the patient's home free of loose throw rugs in walkways, pet beds, electrical cords, etc?   yes      Grab bars in the bathroom? no      Handrails on the stairs?   yes      Adequate lighting?   yes  Depression Screen PHQ 2/9 Scores 12/02/2017 11/30/2016 11/29/2015 11/12/2014  PHQ - 2 Score 0 0 0 0  PHQ- 9 Score - 0 - -     Cognitive Function     6CIT  Screen 12/02/2017 11/30/2016 11/29/2015  What Year? 0 points 0 points 0 points  What month? 0 points 0 points 0 points  What time? 0 points 0 points 0 points  Count back from 20 0 points 0 points 0 points  Months in reverse 0 points - 0 points  Repeat phrase 0 points 0 points 4  points  Total Score 0 - 4    Immunization History  Administered Date(s) Administered  . Influenza, High Dose Seasonal PF 11/06/2017  . Influenza-Unspecified 09/20/2014, 10/16/2015, 11/09/2016  . Pneumococcal Conjugate-13 01/01/2014  . Pneumococcal Polysaccharide-23 11/29/2015  . Td 08/07/2004  . Tdap 09/29/2010  . Zoster 02/20/2010    Qualifies for Shingles Vaccine? Yes, educated and ordered to pharmacy  Screening Tests Health Maintenance  Topic Date Due  . COLONOSCOPY  01/09/2018  . MAMMOGRAM  02/26/2019  . TETANUS/TDAP  09/28/2020  . INFLUENZA VACCINE  Completed  . DEXA SCAN  Completed  . Hepatitis C Screening  Completed  . PNA vac Low Risk Adult  Completed    Cancer Screenings: Lung: Low Dose CT Chest recommended if Age 58-80 years, 30 pack-year currently smoking OR have quit w/in 15years. Patient does not qualify. Breast:  Up to date on Mammogram? Yes   Up to date of Bone Density/Dexa? Yes Colorectal: up to date. Cologuard information filled out for January  Additional Screenings:  Hepatitis C Screening: declined     Plan:    I have personally reviewed and addressed the Medicare Annual Wellness questionnaire and have noted the following in the patient's chart:  A. Medical and social history B. Use of alcohol, tobacco or illicit drugs  C. Current medications and supplements D. Functional ability and status E.  Nutritional status F.  Physical activity G. Advance directives H. List of other physicians I.  Hospitalizations, surgeries, and ER visits in previous 12 months J.  Vitals K. Screenings to include hearing, vision, cognitive, depression L. Referrals and appointments - none  In  addition, I have reviewed and discussed with patient certain preventive protocols, quality metrics, and best practice recommendations. A written personalized care plan for preventive services as well as general preventive health recommendations were provided to patient.  See attached scanned questionnaire for additional information.   Signed,   Chelsea Russell, RN Nurse Health Advisor  Patient Concerns: none

## 2017-12-02 NOTE — Patient Instructions (Addendum)
Ms. Chelsea Hughes , Thank you for taking time to come for your Medicare Wellness Visit. I appreciate your ongoing commitment to your health goals. Please review the following plan we discussed and let me know if I can assist you in the future.   Screening recommendations/referrals: Colonoscopy up to date, due 01/09/2018  Mammogram up to date, due 02/25/2018 Bone Density up to date Recommended yearly ophthalmology/optometry visit for glaucoma screening and checkup Recommended yearly dental visit for hygiene and checkup  Vaccinations: Influenza vaccine up to date Pneumococcal vaccine up to date, completed Tdap vaccine up to date, due 09/28/2020 Shingles vaccine due, ordered to pharmacy    Advanced directives: Advance directive discussed with you today. I have provided a copy for you to complete at home and have notarized. Once this is complete please bring a copy in to our office so we can scan it into your chart.  Conditions/risks identified: none  Next appointment: Dr. Laural BenesJohnson 12/13/2017 @ 9am           Medicare Visit 12/05/2018 @ 9:30am   Preventive Care 65 Years and Older, Female Preventive care refers to lifestyle choices and visits with your health care provider that can promote health and wellness. What does preventive care include?  A yearly physical exam. This is also called an annual well check.  Dental exams once or twice a year.  Routine eye exams. Ask your health care provider how often you should have your eyes checked.  Personal lifestyle choices, including:  Daily care of your teeth and gums.  Regular physical activity.  Eating a healthy diet.  Avoiding tobacco and drug use.  Limiting alcohol use.  Practicing safe sex.  Taking low-dose aspirin every day.  Taking vitamin and mineral supplements as recommended by your health care provider. What happens during an annual well check? The services and screenings done by your health care provider during your annual well  check will depend on your age, overall health, lifestyle risk factors, and family history of disease. Counseling  Your health care provider may ask you questions about your:  Alcohol use.  Tobacco use.  Drug use.  Emotional well-being.  Home and relationship well-being.  Sexual activity.  Eating habits.  History of falls.  Memory and ability to understand (cognition).  Work and work Astronomerenvironment.  Reproductive health. Screening  You may have the following tests or measurements:  Height, weight, and BMI.  Blood pressure.  Lipid and cholesterol levels. These may be checked every 5 years, or more frequently if you are over [id number] years old.  Skin check.  Lung cancer screening. You may have this screening every year starting at age 69 if you have a 30-pack-year history of smoking and currently smoke or have quit within the past 15 years.  Fecal occult blood test (FOBT) of the stool. You may have this test every year starting at age 69.  Flexible sigmoidoscopy or colonoscopy. You may have a sigmoidoscopy every 5 years or a colonoscopy every 10 years starting at age 69.  Hepatitis C blood test.  Hepatitis B blood test.  Sexually transmitted disease (STD) testing.  Diabetes screening. This is done by checking your blood sugar (glucose) after you have not eaten for a while (fasting). You may have this done every 1-3 years.  Bone density scan. This is done to screen for osteoporosis. You may have this done starting at age 69.  Mammogram. This may be done every 1-2 years. Talk to your health care provider about how  often you should have regular mammograms. Talk with your health care provider about your test results, treatment options, and if necessary, the need for more tests. Vaccines  Your health care provider may recommend certain vaccines, such as:  Influenza vaccine. This is recommended every year.  Tetanus, diphtheria, and acellular pertussis (Tdap, Td) vaccine. You  may need a Td booster every 10 years.  Zoster vaccine. You may need this after age 27.  Pneumococcal 13-valent conjugate (PCV13) vaccine. One dose is recommended after age 69.  Pneumococcal polysaccharide (PPSV23) vaccine. One dose is recommended after age 32. Talk to your health care provider about which screenings and vaccines you need and how often you need them. This information is not intended to replace advice given to you by your health care provider. Make sure you discuss any questions you have with your health care provider. Document Released: 02/01/2015 Document Revised: 09/25/2015 Document Reviewed: 11/06/2014 Elsevier Interactive Patient Education  2017 [employer] Prevention in the Home Falls can cause injuries. They can happen to people of all ages. There are many things you can do to make your home safe and to help prevent falls. What can I do on the outside of my home?  Regularly fix the edges of walkways and driveways and fix any cracks.  Remove anything that might make you trip as you walk through a door, such as a raised step or threshold.  Trim any bushes or trees on the path to your home.  Use bright outdoor lighting.  Clear any walking paths of anything that might make someone trip, such as rocks or tools.  Regularly check to see if handrails are loose or broken. Make sure that both sides of any steps have handrails.  Any raised decks and porches should have guardrails on the edges.  Have any leaves, snow, or ice cleared regularly.  Use sand or salt on walking paths during winter.  Clean up any spills in your garage right away. This includes oil or grease spills. What can I do in the bathroom?  Use night lights.  Install grab bars by the toilet and in the tub and shower. Do not use towel bars as grab bars.  Use non-skid mats or decals in the tub or shower.  If you need to sit down in the shower, use a plastic, non-slip stool.  Keep the floor  dry. Clean up any water that spills on the floor as soon as it happens.  Remove soap buildup in the tub or shower regularly.  Attach bath mats securely with double-sided non-slip rug tape.  Do not have throw rugs and other things on the floor that can make you trip. What can I do in the bedroom?  Use night lights.  Make sure that you have a light by your bed that is easy to reach.  Do not use any sheets or blankets that are too big for your bed. They should not hang down onto the floor.  Have a firm chair that has side arms. You can use this for support while you get dressed.  Do not have throw rugs and other things on the floor that can make you trip. What can I do in the kitchen?  Clean up any spills right away.  Avoid walking on wet floors.  Keep items that you use a lot in easy-to-reach places.  If you need to reach something above you, use a strong step stool that has a grab bar.  Keep electrical  cords out of the way.  Do not use floor polish or wax that makes floors slippery. If you must use wax, use non-skid floor wax.  Do not have throw rugs and other things on the floor that can make you trip. What can I do with my stairs?  Do not leave any items on the stairs.  Make sure that there are handrails on both sides of the stairs and use them. Fix handrails that are broken or loose. Make sure that handrails are as long as the stairways.  Check any carpeting to make sure that it is firmly attached to the stairs. Fix any carpet that is loose or worn.  Avoid having throw rugs at the top or bottom of the stairs. If you do have throw rugs, attach them to the floor with carpet tape.  Make sure that you have a light switch at the top of the stairs and the bottom of the stairs. If you do not have them, ask someone to add them for you. What else can I do to help prevent falls?  Wear shoes that:  Do not have high heels.  Have rubber bottoms.  Are comfortable and fit you  well.  Are closed at the toe. Do not wear sandals.  If you use a stepladder:  Make sure that it is fully opened. Do not climb a closed stepladder.  Make sure that both sides of the stepladder are locked into place.  Ask someone to hold it for you, if possible.  Clearly mark and make sure that you can see:  Any grab bars or handrails.  First and last steps.  Where the edge of each step is.  Use tools that help you move around (mobility aids) if they are needed. These include:  Canes.  Walkers.  Scooters.  Crutches.  Turn on the lights when you go into a dark area. Replace any light bulbs as soon as they burn out.  Set up your furniture so you have a clear path. Avoid moving your furniture around.  If any of your floors are uneven, fix them.  If there are any pets around you, be aware of where they are.  Review your medicines with your doctor. Some medicines can make you feel dizzy. This can increase your chance of falling. Ask your doctor what other things that you can do to help prevent falls. This information is not intended to replace advice given to you by your health care provider. Make sure you discuss any questions you have with your health care provider. Document Released: 11/01/2008 Document Revised: 06/13/2015 Document Reviewed: 02/09/2014 Elsevier Interactive Patient Education  2017 [employer].

## 2017-12-06 ENCOUNTER — Encounter: Payer: Medicare Other | Admitting: Family Medicine

## 2017-12-13 ENCOUNTER — Encounter: Payer: Self-pay | Admitting: Family Medicine

## 2017-12-13 ENCOUNTER — Ambulatory Visit (INDEPENDENT_AMBULATORY_CARE_PROVIDER_SITE_OTHER): Payer: Medicare Other | Admitting: Family Medicine

## 2017-12-13 VITALS — BP 145/74 | HR 84 | Temp 98.1°F | Ht 60.0 in | Wt 125.5 lb

## 2017-12-13 DIAGNOSIS — Z0000 Encounter for general adult medical examination without abnormal findings: Secondary | ICD-10-CM

## 2017-12-13 DIAGNOSIS — R3129 Other microscopic hematuria: Secondary | ICD-10-CM

## 2017-12-13 DIAGNOSIS — E782 Mixed hyperlipidemia: Secondary | ICD-10-CM

## 2017-12-13 DIAGNOSIS — Z1211 Encounter for screening for malignant neoplasm of colon: Secondary | ICD-10-CM

## 2017-12-13 DIAGNOSIS — Z1239 Encounter for other screening for malignant neoplasm of breast: Secondary | ICD-10-CM

## 2017-12-13 DIAGNOSIS — M8580 Other specified disorders of bone density and structure, unspecified site: Secondary | ICD-10-CM | POA: Diagnosis not present

## 2017-12-13 LAB — UA/M W/RFLX CULTURE, ROUTINE
BILIRUBIN UA: NEGATIVE
GLUCOSE, UA: NEGATIVE
Leukocytes, UA: NEGATIVE
NITRITE UA: NEGATIVE
PH UA: 6 (ref 5.0–7.5)
Specific Gravity, UA: 1.025 (ref 1.005–1.030)
UUROB: 0.2 mg/dL (ref 0.2–1.0)

## 2017-12-13 LAB — MICROSCOPIC EXAMINATION

## 2017-12-13 NOTE — Progress Notes (Signed)
BP (!) 145/74 (BP Location: Left Arm, Cuff Size: Normal)   Pulse 84   Temp 98.1 F (36.7 C) (Oral)   Ht 5' (1.524 m)   Wt 125 lb 8 oz (56.9 kg)   SpO2 99%   BMI 24.51 kg/m    Subjective:    Patient ID: Chelsea Hughes, female    DOB: March 01, 1950, 67 y.o.   MRN: [id number]  HPI: Chelsea Hughes is a 67 y.o. female presenting on 12/13/2017 for comprehensive medical examination. Current medical complaints include:  HYPERLIPIDEMIA Hyperlipidemia status: stable Satisfied with current treatment?  yes Side effects:  no Medication compliance: good compliance Past cholesterol meds: none Supplements: fish oil Aspirin:  yes The 10-year ASCVD risk score Denman George DC Jr., et al., 2013) is: 10.4%   Values used to calculate the score:     Age: 2 years     Sex: Female     Is Non-Hispanic African American: No     Diabetic: No     Tobacco smoker: No     Systolic Blood Pressure: 145 mmHg     Is BP treated: No     HDL Cholesterol: 64 mg/dL     Total Cholesterol: 202 mg/dL Chest pain:  no Coronary artery disease:  no  Menopausal Symptoms: occasionally- not bothersome  Depression Screen done today and results listed below:  Depression screen [facility] 2/9 12/02/2017 11/30/2016 11/29/2015 11/12/2014  Decreased Interest 0 0 0 0  Down, Depressed, Hopeless 0 0 0 0  PHQ - 2 Score 0 0 0 0  Altered sleeping - 0 - -  Tired, decreased energy - 0 - -  Change in appetite - 0 - -  Feeling bad or failure about yourself  - 0 - -  Trouble concentrating - 0 - -  Moving slowly or fidgety/restless - 0 - -  Suicidal thoughts - 0 - -  PHQ-9 Score - 0 - -  Difficult doing work/chores - Not difficult at all - -    Past Medical History:  Past Medical History:  Diagnosis Date  . Atypical squamous cell changes of undetermined significance (ASCUS) on cervical cytology with positive high risk human papilloma virus (HPV)   . Diverticulosis    on Colonoscopy 2007; CT scan 2014  . Microscopic hematuria    evaluated by Urology in 2014, abd/pelvis CT scan Dec 2014  . Osteoporosis     Surgical History:  History reviewed. No pertinent surgical history.  Medications:  Current Outpatient Medications on File Prior to Visit  Medication Sig  . aspirin EC 81 MG tablet Take 81 mg by mouth daily.  . Biotin 56213 MCG TABS Take by mouth.  . Calcium Carbonate (CALCIUM 600 PO) Take 600 mg by mouth 2 (two) times daily.  . Flaxseed, Linseed, (FLAXSEED OIL) 1000 MG CAPS Take 1,000 mg by mouth daily.  Marland Kitchen loratadine (CLARITIN) 10 MG tablet Take 10 mg by mouth daily.  . Multiple Vitamin (MULTIVITAMIN) tablet Take 1 tablet by mouth daily.  . Omega-3 Fatty Acids (FISH OIL) 1200 MG CAPS Take 1,200 mg by mouth daily.   No current facility-administered medications on file prior to visit.     Allergies:  No Known Allergies  Social History:  Social History   Socioeconomic History  . Marital status: Widowed    Spouse name: Not on file  . Number of children: Not on file  . Years of education: Not on file  . Highest education level: Not on file  Occupational  History  . Not on file  Social Needs  . Financial resource strain: Not hard at all  . Food insecurity:    Worry: Never true    Inability: Never true  . Transportation needs:    Medical: No    Non-medical: No  Tobacco Use  . Smoking status: Never Smoker  . Smokeless tobacco: Never Used  Substance and Sexual Activity  . Alcohol use: Yes    Alcohol/week: 7.0 standard drinks    Types: 7 Glasses of wine per week    Comment: wine  . Drug use: No  . Sexual activity: Not Currently  Lifestyle  . Physical activity:    Days per week: 5 days    Minutes per session: 40 min  . Stress: Only a little  Relationships  . Social connections:    Talks on phone: More than three times a week    Gets together: More than three times a week    Attends religious service: More than 4 times per year    Active member of club or organization: Yes    Attends  meetings of clubs or organizations: More than 4 times per year    Relationship status: Widowed  . Intimate partner violence:    Fear of current or ex partner: No    Emotionally abused: No    Physically abused: No    Forced sexual activity: No  Other Topics Concern  . Not on file  Social History Narrative  . Not on file   Social History   Tobacco Use  Smoking Status Never Smoker  Smokeless Tobacco Never Used   Social History   Substance and Sexual Activity  Alcohol Use Yes  . Alcohol/week: 7.0 standard drinks  . Types: 7 Glasses of wine per week   Comment: wine    Family History:  Family History  Problem Relation Age of Onset  . Heart disease Mother        CABG  . Hypertension Mother   . Osteoporosis Mother   . Thyroid disease Mother   . Heart disease Father   . Hypertension Father   . Cancer Father        throat (smoker)  . COPD Neg Hx   . Diabetes Neg Hx   . Stroke Neg Hx   . Breast cancer Neg Hx     Past medical history, surgical history, medications, allergies, family history and social history reviewed with patient today and changes made to appropriate areas of the chart.   Review of Systems  Constitutional: Negative.   HENT: Negative.   Eyes: Negative.   Respiratory: Negative.   Cardiovascular: Negative.   Gastrointestinal: Negative.   Genitourinary: Negative.   Musculoskeletal: Negative.   Skin: Negative.   Neurological: Negative.   Endo/Heme/Allergies: Negative.   Psychiatric/Behavioral: Negative.     All other ROS negative except what is listed above and in the HPI.      Objective:    BP (!) 145/74 (BP Location: Left Arm, Cuff Size: Normal)   Pulse 84   Temp 98.1 F (36.7 C) (Oral)   Ht 5' (1.524 m)   Wt 125 lb 8 oz (56.9 kg)   SpO2 99%   BMI 24.51 kg/m   Wt Readings from Last 3 Encounters:  12/13/17 125 lb 8 oz (56.9 kg)  12/02/17 128 lb (58.1 kg)  11/30/16 125 lb 1 oz (56.7 kg)    Physical Exam  Constitutional: She is  oriented to person, place, and time.  She appears well-developed and well-nourished. No distress.  HENT:  Head: Normocephalic and atraumatic.  Right Ear: Hearing, tympanic membrane, external ear and ear canal normal.  Left Ear: Hearing, tympanic membrane, external ear and ear canal normal.  Nose: Nose normal.  Mouth/Throat: Uvula is midline, oropharynx is clear and moist and mucous membranes are normal. No oropharyngeal exudate.  Eyes: Pupils are equal, round, and reactive to light. Conjunctivae, EOM and lids are normal. Right eye exhibits no discharge. Left eye exhibits no discharge. No scleral icterus.  Neck: Normal range of motion. Neck supple. No JVD present. No tracheal deviation present. No thyromegaly present.  Cardiovascular: Normal rate, regular rhythm, normal heart sounds and intact distal pulses. Exam reveals no gallop and no friction rub.  No murmur heard. Pulmonary/Chest: Effort normal and breath sounds normal. No stridor. No respiratory distress. She has no wheezes. She has no rales. She exhibits no tenderness.  Abdominal: Soft. Bowel sounds are normal. She exhibits no distension and no mass. There is no tenderness. There is no rebound and no guarding. No hernia.  Genitourinary:  Genitourinary Comments: Breast and pelvic exams deferred with shared decision making  Musculoskeletal: Normal range of motion. She exhibits no edema, tenderness or deformity.  Lymphadenopathy:    She has no cervical adenopathy.  Neurological: She is alert and oriented to person, place, and time. She displays normal reflexes. No cranial nerve deficit or sensory deficit. She exhibits normal muscle tone. Coordination normal.  Skin: Skin is warm, dry and intact. Capillary refill takes less than 2 seconds. No rash noted. She is not diaphoretic. No erythema. No pallor.  Psychiatric: She has a normal mood and affect. Her speech is normal and behavior is normal. Judgment and thought content normal. Cognition and  memory are normal.  Nursing note and vitals reviewed.   Results for orders placed or performed in visit on 11/30/16  Microscopic Examination  Result Value Ref Range   WBC, UA 0-5 0 - 5 /hpf   RBC, UA 0-2 0 - 2 /hpf   Epithelial Cells (non renal) 0-10 0 - 10 /hpf   Renal Epithel, UA 0-10 (A) None seen /hpf   Bacteria, UA None seen None seen/Few  CBC with Differential/Platelet  Result Value Ref Range   WBC 7.3 3.4 - 10.8 x10E3/uL   RBC 4.62 3.77 - 5.28 x10E6/uL   Hemoglobin 12.6 11.1 - 15.9 g/dL   Hematocrit 74.238.8 59.534.0 - 46.6 %   MCV 84 79 - 97 fL   MCH 27.3 26.6 - 33.0 pg   MCHC 32.5 31.5 - 35.7 g/dL   RDW 63.815.1 75.612.3 - 43.315.4 %   Platelets 344 150 - 379 x10E3/uL   Neutrophils 64 Not Estab. %   Lymphs 25 Not Estab. %   Monocytes 9 Not Estab. %   Eos 2 Not Estab. %   Basos 0 Not Estab. %   Neutrophils Absolute 4.7 1.4 - 7.0 x10E3/uL   Lymphocytes Absolute 1.8 0.7 - 3.1 x10E3/uL   Monocytes Absolute 0.7 0.1 - 0.9 x10E3/uL   EOS (ABSOLUTE) 0.2 0.0 - 0.4 x10E3/uL   Basophils Absolute 0.0 0.0 - 0.2 x10E3/uL   Immature Granulocytes 0 Not Estab. %   Immature Grans (Abs) 0.0 0.0 - 0.1 x10E3/uL  Comprehensive metabolic panel  Result Value Ref Range   Glucose 86 65 - 99 mg/dL   BUN 14 8 - 27 mg/dL   Creatinine, Ser 2.950.85 0.57 - 1.00 mg/dL   GFR calc non Af Amer 71 >59 mL/min/1.73  GFR calc Af Amer 82 >59 mL/min/1.73   BUN/Creatinine Ratio 16 12 - 28   Sodium 143 134 - 144 mmol/L   Potassium 4.7 3.5 - 5.2 mmol/L   Chloride 100 96 - 106 mmol/L   CO2 26 20 - 29 mmol/L   Calcium 9.8 8.7 - 10.3 mg/dL   Total Protein 7.0 6.0 - 8.5 g/dL   Albumin 4.4 3.6 - 4.8 g/dL   Globulin, Total 2.6 1.5 - 4.5 g/dL   Albumin/Globulin Ratio 1.7 1.2 - 2.2   Bilirubin Total 0.2 0.0 - 1.2 mg/dL   Alkaline Phosphatase 48 39 - 117 IU/L   AST 17 0 - 40 IU/L   ALT 12 0 - 32 IU/L  Lipid Panel w/o Chol/HDL Ratio  Result Value Ref Range   Cholesterol, Total 202 (H) 100 - 199 mg/dL   Triglycerides 161 0 -  149 mg/dL   HDL 64 >09 mg/dL   VLDL Cholesterol Cal 26 5 - 40 mg/dL   LDL Calculated 604 (H) 0 - 99 mg/dL  TSH  Result Value Ref Range   TSH 2.890 0.450 - 4.500 uIU/mL  UA/M w/rflx Culture, Routine  Result Value Ref Range   Specific Gravity, UA <1.005 (L) 1.005 - 1.030   pH, UA 6.0 5.0 - 7.5   Color, UA Yellow Yellow   Appearance Ur Clear Clear   Leukocytes, UA Negative Negative   Protein, UA Negative Negative/Trace   Glucose, UA Negative Negative   Ketones, UA Negative Negative   RBC, UA Trace (A) Negative   Bilirubin, UA Negative Negative   Urobilinogen, Ur 0.2 0.2 - 1.0 mg/dL   Nitrite, UA Negative Negative   Microscopic Examination See below:       Assessment & Plan:   Problem List Items Addressed This Visit      Musculoskeletal and Integument   Osteopenia    Checking vitamin D today. Await results. Call with any concerns. Will stop evista. Recheck DEXA 2021.      Relevant Orders   Comprehensive metabolic panel   VITAMIN D 25 Hydroxy (Vit-D Deficiency, Fractures)     Genitourinary   Microscopic hematuria    Rechecking labs today. Await results. Call with any concerns.       Relevant Orders   CBC with Differential/Platelet   Comprehensive metabolic panel   UA/M w/rflx Culture, Routine     Other   Hyperlipidemia    Rechecking labs today. Await results. Call with any concerns.       Relevant Orders   Comprehensive metabolic panel   Lipid Panel w/o Chol/HDL Ratio   TSH    Other Visit Diagnoses    Routine general medical examination at a health care facility    -  Primary   Vaccines up to date. Screening labs checked today. Mammogram and cologuard ordered. DEXA up to date. Continue diet and exercise. Call with any concerns.    Screening for colon cancer       Due for cologuard. Order placed today.   Relevant Orders   Cologuard   Screening for breast cancer       Mammogram ordered today.   Relevant Orders   MM DIGITAL SCREENING BILATERAL        Follow up plan: Return in about 1 year (around 12/14/2018) for Physical.   LABORATORY TESTING:  - Pap smear: not applicable  IMMUNIZATIONS:   - Tdap: Tetanus vaccination status reviewed: last tetanus booster within 10 years. - Influenza: Up to date -  Pneumovax: Up to date - Prevnar: Up to date - HPV: Not applicable - Zostavax vaccine: Up to date  SCREENING: -Mammogram: Up to date  - Colonoscopy: Due for cologuard 01/02/18  - Bone Density: Up to date   PATIENT COUNSELING:   Advised to take 1 mg of folate supplement per day if capable of pregnancy.   Sexuality: Discussed sexually transmitted diseases, partner selection, use of condoms, avoidance of unintended pregnancy  and contraceptive alternatives.   Advised to avoid cigarette smoking.  I discussed with the patient that most people either abstain from alcohol or drink within safe limits (<=14/week and <=4 drinks/occasion for males, <=7/weeks and <= 3 drinks/occasion for females) and that the risk for alcohol disorders and other health effects rises proportionally with the number of drinks per week and how often a drinker exceeds daily limits.  Discussed cessation/primary prevention of drug use and availability of treatment for abuse.   Diet: Encouraged to adjust caloric intake to maintain  or achieve ideal body weight, to reduce intake of dietary saturated fat and total fat, to limit sodium intake by avoiding high sodium foods and not adding table salt, and to maintain adequate dietary potassium and calcium preferably from fresh fruits, vegetables, and low-fat dairy products.    stressed the importance of regular exercise  Injury prevention: Discussed safety belts, safety helmets, smoke detector, smoking near bedding or upholstery.   Dental health: Discussed importance of regular tooth brushing, flossing, and dental visits.    NEXT PREVENTATIVE PHYSICAL DUE IN 1 YEAR. Return in about 1 year (around 12/14/2018) for  Physical.

## 2017-12-13 NOTE — Assessment & Plan Note (Signed)
Rechecking labs today. Await results. Call with any concerns.  

## 2017-12-13 NOTE — Patient Instructions (Addendum)
[facility]  Address: [address], [place], [place] [address]  Phone: [phone]   Health Maintenance for Postmenopausal Women Menopause is a normal process in which your reproductive ability comes to an end. This process happens gradually over a span of months to years, usually between the ages of 48 and 55. Menopause is complete when you have missed 12 consecutive menstrual periods. It is important to talk with your health care provider about some of the most common conditions that affect postmenopausal women, such as heart disease, cancer, and bone loss (osteoporosis). Adopting a healthy lifestyle and getting preventive care can help to promote your health and wellness. Those actions can also lower your chances of developing some of these common conditions. What should I know about menopause? During menopause, you may experience a number of symptoms, such as:  Moderate-to-severe hot flashes.  Night sweats.  Decrease in sex drive.  Mood swings.  Headaches.  Tiredness.  Irritability.  Memory problems.  Insomnia.  Choosing to treat or not to treat menopausal changes is an individual decision that you make with your health care provider. What should I know about hormone replacement therapy and supplements? Hormone therapy products are effective for treating symptoms that are associated with menopause, such as hot flashes and night sweats. Hormone replacement carries certain risks, especially as you become older. If you are thinking about using estrogen or estrogen with progestin treatments, discuss the benefits and risks with your health care provider. What should I know about heart disease and stroke? Heart disease, heart attack, and stroke become more likely as you age. This may be due, in part, to the hormonal changes that your body experiences during menopause. These can affect how your body processes dietary fats, triglycerides, and  cholesterol. Heart attack and stroke are both medical emergencies. There are many things that you can do to help prevent heart disease and stroke:  Have your blood pressure checked at least every 1-2 years. High blood pressure causes heart disease and increases the risk of stroke.  If you are 55-79 years old, ask your health care provider if you should take aspirin to prevent a heart attack or a stroke.  Do not use any tobacco products, including cigarettes, chewing tobacco, or electronic cigarettes. If you need help quitting, ask your health care provider.  It is important to eat a healthy diet and maintain a healthy weight. ? Be sure to include plenty of vegetables, fruits, low-fat dairy products, and lean protein. ? Avoid eating foods that are high in solid fats, added sugars, or salt (sodium).  Get regular exercise. This is one of the most important things that you can do for your health. ? Try to exercise for at least 150 minutes each week. The type of exercise that you do should increase your heart rate and make you sweat. This is known as moderate-intensity exercise. ? Try to do strengthening exercises at least twice each week. Do these in addition to the moderate-intensity exercise.  Know your numbers.Ask your health care provider to check your cholesterol and your blood glucose. Continue to have your blood tested as directed by your health care provider.  What should I know about cancer screening? There are several types of cancer. Take the following steps to reduce your risk and to catch any cancer development as early as possible. Breast Cancer  Practice breast self-awareness. ? This means understanding how your breasts normally appear and feel. ? It also means   doing regular breast self-exams. Let your health care provider know about any changes, no matter how small.  If you are 40 or older, have a clinician do a breast exam (clinical breast exam or CBE) every year. Depending  on your age, family history, and medical history, it may be recommended that you also have a yearly breast X-ray (mammogram).  If you have a family history of breast cancer, talk with your health care provider about genetic screening.  If you are at high risk for breast cancer, talk with your health care provider about having an MRI and a mammogram every year.  Breast cancer (BRCA) gene test is recommended for women who have family members with BRCA-related cancers. Results of the assessment will determine the need for genetic counseling and BRCA1 and for BRCA2 testing. BRCA-related cancers include these types: ? Breast. This occurs in males or females. ? Ovarian. ? Tubal. This may also be called fallopian tube cancer. ? Cancer of the abdominal or pelvic lining (peritoneal cancer). ? Prostate. ? Pancreatic.  Cervical, Uterine, and Ovarian Cancer Your health care provider may recommend that you be screened regularly for cancer of the pelvic organs. These include your ovaries, uterus, and vagina. This screening involves a pelvic exam, which includes checking for microscopic changes to the surface of your cervix (Pap test).  For women ages 21-65, health care providers may recommend a pelvic exam and a Pap test every three years. For women ages 30-65, they may recommend the Pap test and pelvic exam, combined with testing for human papilloma virus (HPV), every five years. Some types of HPV increase your risk of cervical cancer. Testing for HPV may also be done on women of any age who have unclear Pap test results.  Other health care providers may not recommend any screening for nonpregnant women who are considered low risk for pelvic cancer and have no symptoms. Ask your health care provider if a screening pelvic exam is right for you.  If you have had past treatment for cervical cancer or a condition that could lead to cancer, you need Pap tests and screening for cancer for at least 20 years after  your treatment. If Pap tests have been discontinued for you, your risk factors (such as having a new sexual partner) need to be reassessed to determine if you should start having screenings again. Some women have medical problems that increase the chance of getting cervical cancer. In these cases, your health care provider may recommend that you have screening and Pap tests more often.  If you have a family history of uterine cancer or ovarian cancer, talk with your health care provider about genetic screening.  If you have vaginal bleeding after reaching menopause, tell your health care provider.  There are currently no reliable tests available to screen for ovarian cancer.  Lung Cancer Lung cancer screening is recommended for adults 55-80 years old who are at high risk for lung cancer because of a history of smoking. A yearly low-dose CT scan of the lungs is recommended if you:  Currently smoke.  Have a history of at least 30 pack-years of smoking and you currently smoke or have quit within the past 15 years. A pack-year is smoking an average of one pack of cigarettes per day for one year.  Yearly screening should:  Continue until it has been 15 years since you quit.  Stop if you develop a health problem that would prevent you from having lung cancer treatment.  Colorectal   Cancer  This type of cancer can be detected and can often be prevented.  Routine colorectal cancer screening usually begins at age 50 and continues through age 75.  If you have risk factors for colon cancer, your health care provider may recommend that you be screened at an earlier age.  If you have a family history of colorectal cancer, talk with your health care provider about genetic screening.  Your health care provider may also recommend using home test kits to check for hidden blood in your stool.  A small camera at the end of a tube can be used to examine your colon directly (sigmoidoscopy or colonoscopy).  This is done to check for the earliest forms of colorectal cancer.  Direct examination of the colon should be repeated every 5-10 years until age 75. However, if early forms of precancerous polyps or small growths are found or if you have a family history or genetic risk for colorectal cancer, you may need to be screened more often.  Skin Cancer  Check your skin from head to toe regularly.  Monitor any moles. Be sure to tell your health care provider: ? About any new moles or changes in moles, especially if there is a change in a mole's shape or color. ? If you have a mole that is larger than the size of a pencil eraser.  If any of your family members has a history of skin cancer, especially at a young age, talk with your health care provider about genetic screening.  Always use sunscreen. Apply sunscreen liberally and repeatedly throughout the day.  Whenever you are outside, protect yourself by wearing long sleeves, pants, a wide-brimmed hat, and sunglasses.  What should I know about osteoporosis? Osteoporosis is a condition in which bone destruction happens more quickly than new bone creation. After menopause, you may be at an increased risk for osteoporosis. To help prevent osteoporosis or the bone fractures that can happen because of osteoporosis, the following is recommended:  If you are 19-50 years old, get at least 1,000 mg of calcium and at least 600 mg of vitamin D per day.  If you are older than age 50 but younger than age 70, get at least 1,200 mg of calcium and at least 600 mg of vitamin D per day.  If you are older than age 70, get at least 1,200 mg of calcium and at least 800 mg of vitamin D per day.  Smoking and excessive alcohol intake increase the risk of osteoporosis. Eat foods that are rich in calcium and vitamin D, and do weight-bearing exercises several times each week as directed by your health care provider. What should I know about how menopause affects my mental  health? Depression may occur at any age, but it is more common as you become older. Common symptoms of depression include:  Low or sad mood.  Changes in sleep patterns.  Changes in appetite or eating patterns.  Feeling an overall lack of motivation or enjoyment of activities that you previously enjoyed.  Frequent crying spells.  Talk with your health care provider if you think that you are experiencing depression. What should I know about immunizations? It is important that you get and maintain your immunizations. These include:  Tetanus, diphtheria, and pertussis (Tdap) booster vaccine.  Influenza every year before the flu season begins.  Pneumonia vaccine.  Shingles vaccine.  Your health care provider may also recommend other immunizations. This information is not intended to replace advice given to   your health care provider. Make sure you discuss any questions you have with your health care provider. Document Released: 02/27/2005 Document Revised: 07/26/2015 Document Reviewed: 10/09/2014 Elsevier Interactive Patient Education  2018 [employer]  

## 2017-12-13 NOTE — Assessment & Plan Note (Addendum)
Checking vitamin D today. Await results. Call with any concerns. Will stop evista. Recheck DEXA 2021.

## 2017-12-14 ENCOUNTER — Encounter: Payer: Self-pay | Admitting: Family Medicine

## 2017-12-14 LAB — LIPID PANEL W/O CHOL/HDL RATIO
Cholesterol, Total: 198 mg/dL (ref 100–199)
HDL: 67 mg/dL (ref >39)
LDL CALC: 108 mg/dL — AB (ref 0–99)
TRIGLYCERIDES: 114 mg/dL (ref 0–149)
VLDL CHOLESTEROL CAL: 23 mg/dL (ref 5–40)

## 2017-12-14 LAB — COMPREHENSIVE METABOLIC PANEL
A/G RATIO: 1.8 (ref 1.2–2.2)
ALBUMIN: 4.3 g/dL (ref 3.6–4.8)
ALT: 12 IU/L (ref 0–32)
AST: 17 IU/L (ref 0–40)
Alkaline Phosphatase: 47 IU/L (ref 39–117)
BUN / CREAT RATIO: 18 (ref 12–28)
BUN: 15 mg/dL (ref 8–27)
Bilirubin Total: 0.2 mg/dL (ref 0.0–1.2)
CO2: 25 mmol/L (ref 20–29)
Calcium: 9.7 mg/dL (ref 8.7–10.3)
Chloride: 104 mmol/L (ref 96–106)
Creatinine, Ser: 0.84 mg/dL (ref 0.57–1.00)
GFR calc Af Amer: 82 mL/min/{1.73_m2} (ref >59)
GFR calc non Af Amer: 71 mL/min/{1.73_m2} (ref >59)
GLOBULIN, TOTAL: 2.4 g/dL (ref 1.5–4.5)
Glucose: 83 mg/dL (ref 65–99)
POTASSIUM: 4.3 mmol/L (ref 3.5–5.2)
SODIUM: 145 mmol/L — AB (ref 134–144)
Total Protein: 6.7 g/dL (ref 6.0–8.5)

## 2017-12-14 LAB — CBC WITH DIFFERENTIAL/PLATELET
BASOS: 1 %
Basophils Absolute: 0 10*3/uL (ref 0.0–0.2)
EOS (ABSOLUTE): 0.1 10*3/uL (ref 0.0–0.4)
EOS: 2 %
HEMATOCRIT: 43.1 % (ref 34.0–46.6)
HEMOGLOBIN: 14.5 g/dL (ref 11.1–15.9)
IMMATURE GRANULOCYTES: 0 %
Immature Grans (Abs): 0 10*3/uL (ref 0.0–0.1)
LYMPHS ABS: 1.4 10*3/uL (ref 0.7–3.1)
Lymphs: 23 %
MCH: 30.8 pg (ref 26.6–33.0)
MCHC: 33.6 g/dL (ref 31.5–35.7)
MCV: 92 fL (ref 79–97)
MONOCYTES: 8 %
MONOS ABS: 0.5 10*3/uL (ref 0.1–0.9)
Neutrophils Absolute: 4.1 10*3/uL (ref 1.4–7.0)
Neutrophils: 66 %
Platelets: 283 10*3/uL (ref 150–450)
RBC: 4.71 x10E6/uL (ref 3.77–5.28)
RDW: 12.6 % (ref 12.3–15.4)
WBC: 6.2 10*3/uL (ref 3.4–10.8)

## 2017-12-14 LAB — VITAMIN D 25 HYDROXY (VIT D DEFICIENCY, FRACTURES): VIT D 25 HYDROXY: 37.3 ng/mL (ref 30.0–100.0)

## 2017-12-14 LAB — TSH: TSH: 2.27 u[IU]/mL (ref 0.450–4.500)

## 2018-01-06 LAB — COLOGUARD: COLOGUARD: NEGATIVE

## 2018-01-31 ENCOUNTER — Other Ambulatory Visit: Payer: Self-pay | Admitting: Family Medicine

## 2018-01-31 DIAGNOSIS — Z1231 Encounter for screening mammogram for malignant neoplasm of breast: Secondary | ICD-10-CM

## 2018-02-28 ENCOUNTER — Encounter: Payer: Self-pay | Admitting: Family Medicine

## 2018-02-28 ENCOUNTER — Ambulatory Visit
Admission: RE | Admit: 2018-02-28 | Discharge: 2018-02-28 | Disposition: A | Discharge status: Home / Self Care | Payer: Medicare Other | Source: Ambulatory Visit | Attending: Family Medicine | Admitting: Family Medicine

## 2018-02-28 DIAGNOSIS — Z1231 Encounter for screening mammogram for malignant neoplasm of breast: Secondary | ICD-10-CM | POA: Insufficient documentation

## 2018-10-27 ENCOUNTER — Other Ambulatory Visit (HOSPITAL_COMMUNITY)
Admission: RE | Admit: 2018-10-27 | Discharge: 2018-10-27 | Disposition: A | Discharge status: Home / Self Care | Payer: Medicare Other | Source: Ambulatory Visit | Attending: Unknown Physician Specialty | Admitting: Unknown Physician Specialty

## 2018-10-27 ENCOUNTER — Ambulatory Visit (INDEPENDENT_AMBULATORY_CARE_PROVIDER_SITE_OTHER): Payer: Medicare Other | Admitting: Unknown Physician Specialty

## 2018-10-27 ENCOUNTER — Other Ambulatory Visit: Payer: Self-pay

## 2018-10-27 ENCOUNTER — Encounter: Payer: Self-pay | Admitting: Unknown Physician Specialty

## 2018-10-27 VITALS — BP 156/91 | HR 81 | Temp 98.8°F

## 2018-10-27 DIAGNOSIS — L659 Nonscarring hair loss, unspecified: Secondary | ICD-10-CM | POA: Diagnosis not present

## 2018-10-27 DIAGNOSIS — B079 Viral wart, unspecified: Secondary | ICD-10-CM

## 2018-10-27 DIAGNOSIS — Z780 Asymptomatic menopausal state: Secondary | ICD-10-CM | POA: Diagnosis not present

## 2018-10-27 DIAGNOSIS — N898 Other specified noninflammatory disorders of vagina: Secondary | ICD-10-CM

## 2018-10-27 MED ORDER — FLUCONAZOLE 150 MG PO TABS: 150.0000 mg | ORAL_TABLET | Freq: Once | ORAL | 0 refills | Status: AC

## 2018-10-27 NOTE — Addendum Note (Signed)
Addended by: Kathrine Haddock on: 10/27/2018 04:27 PM   Modules accepted: Orders

## 2018-10-27 NOTE — Progress Notes (Addendum)
BP (!) 156/91   Pulse 81   Temp 98.8 F (37.1 C)   SpO2 96%    Subjective:    Patient ID: Chelsea Hughes, female    DOB: 10-07-1950, 68 y.o.   MRN: [id number]  HPI: Chelsea Hughes is a 68 y.o. female  Chief Complaint  Patient presents with  . Vaginal Itching    more irritation   Pt is here with vaginal itching off and on for several months.  This is relieived by tea tree oil, vagisil and a antibiotic cream.  No discharge.    In the last month, she has noticed a lack of hair on her legs, underarms, and pubic hair thinning.  Hair on head is fine.   Relevant past medical, surgical, family and social history reviewed and updated as indicated. Interim medical history since our last visit reviewed. Allergies and medications reviewed and updated.  Review of Systems  Per HPI unless specifically indicated above     Objective:    BP (!) 156/91   Pulse 81   Temp 98.8 F (37.1 C)   SpO2 96%   Wt Readings from Last 3 Encounters:  12/13/17 125 lb 8 oz (56.9 kg)  12/02/17 128 lb (58.1 kg)  11/30/16 125 lb 1 oz (56.7 kg)    Physical Exam Exam conducted with a chaperone present.  Constitutional:      General: She is not in acute distress.    Appearance: Normal appearance. She is well-developed.  HENT:     Head: Normocephalic and atraumatic.  Eyes:     General: Lids are normal. No scleral icterus.       Right eye: No discharge.        Left eye: No discharge.     Conjunctiva/sclera: Conjunctivae normal.  Neck:     Musculoskeletal: Normal range of motion and neck supple.     Vascular: No carotid bruit or JVD.  Cardiovascular:     Rate and Rhythm: Normal rate and regular rhythm.     Heart sounds: Normal heart sounds.  Pulmonary:     Effort: Pulmonary effort is normal.     Breath sounds: Normal breath sounds.  Abdominal:     Palpations: There is no hepatomegaly or splenomegaly.  Genitourinary:    Labia:        Right: No rash.        Left: Lesion present. No rash.    Vagina: Normal.     Uterus: Normal.      Adnexa: Right adnexa normal and left adnexa normal.     Comments: 2 warty lesions noted  Stenotic Oz.  Small polyp Musculoskeletal: Normal range of motion.  Skin:    General: Skin is warm and dry.     Coloration: Skin is not pale.     Findings: No rash.  Neurological:     Mental Status: She is alert and oriented to person, place, and time.  Psychiatric:        Behavior: Behavior normal.        Thought Content: Thought content normal.        Judgment: Judgment normal.     Results for orders placed or performed in visit on 01/13/18  Cologuard  Result Value Ref Range   Cologuard Negative       Assessment & Plan:   Problem List Items Addressed This Visit    None    Visit Diagnoses    Vaginal itching    -  Primary   No discharge.  Will check HSV, HPV.  Pap obtained.  Will treat for yeast with Diflucan   Relevant Orders   Pap Lb, rfx HPV ASCU   HSV(herpes simplex vrs) 1+2 ab-IgG   Cytology - PAP   Viral warts, unspecified type       Outside of vulvar area.  Discussed with Dr. Laural Benes and unsure HPV will be covered   Relevant Medications   fluconazole (DIFLUCAN) 150 MG tablet   Other Relevant Orders   Pap Lb, rfx HPV ASCU   Cytology - PAP   Hair loss       Check TSH   Relevant Orders   TSH   CBC with Differential/Platelet   Comprehensive metabolic panel       Follow up plan: Return in about 4 weeks (around 11/24/2018).

## 2018-10-27 NOTE — Addendum Note (Signed)
Addended by: Kathrine Haddock on: 10/27/2018 04:05 PM   Modules accepted: Orders

## 2018-10-28 ENCOUNTER — Encounter: Payer: Self-pay | Admitting: Unknown Physician Specialty

## 2018-10-28 LAB — HSV(HERPES SIMPLEX VRS) I + II AB-IGG
HSV 1 Glycoprotein G Ab, IgG: 41.4 index — ABNORMAL HIGH (ref 0.00–0.90)
HSV 2 IgG, Type Spec: <0.91 index (ref 0.00–0.90)

## 2018-10-28 LAB — CBC WITH DIFFERENTIAL/PLATELET
Basophils Absolute: 0 10*3/uL (ref 0.0–0.2)
Basos: 1 %
EOS (ABSOLUTE): 0.1 10*3/uL (ref 0.0–0.4)
Eos: 2 %
Hematocrit: 36 % (ref 34.0–46.6)
Hemoglobin: 12.3 g/dL (ref 11.1–15.9)
Immature Grans (Abs): 0 10*3/uL (ref 0.0–0.1)
Immature Granulocytes: 0 %
Lymphocytes Absolute: 1.4 10*3/uL (ref 0.7–3.1)
Lymphs: 20 %
MCH: 30.8 pg (ref 26.6–33.0)
MCHC: 34.2 g/dL (ref 31.5–35.7)
MCV: 90 fL (ref 79–97)
Monocytes Absolute: 0.5 10*3/uL (ref 0.1–0.9)
Monocytes: 7 %
Neutrophils Absolute: 4.7 10*3/uL (ref 1.4–7.0)
Neutrophils: 70 %
Platelets: 269 10*3/uL (ref 150–450)
RBC: 3.99 x10E6/uL (ref 3.77–5.28)
RDW: 13.1 % (ref 11.7–15.4)
WBC: 6.7 10*3/uL (ref 3.4–10.8)

## 2018-10-28 LAB — COMPREHENSIVE METABOLIC PANEL
ALT: 10 IU/L (ref 0–32)
AST: 15 IU/L (ref 0–40)
Albumin/Globulin Ratio: 1.9 (ref 1.2–2.2)
Albumin: 4.2 g/dL (ref 3.8–4.8)
Alkaline Phosphatase: 51 IU/L (ref 39–117)
BUN/Creatinine Ratio: 19 (ref 12–28)
BUN: 14 mg/dL (ref 8–27)
Bilirubin Total: 0.2 mg/dL (ref 0.0–1.2)
CO2: 26 mmol/L (ref 20–29)
Calcium: 9.8 mg/dL (ref 8.7–10.3)
Chloride: 102 mmol/L (ref 96–106)
Creatinine, Ser: 0.75 mg/dL (ref 0.57–1.00)
GFR calc Af Amer: 94 mL/min/{1.73_m2} (ref >59)
GFR calc non Af Amer: 82 mL/min/{1.73_m2} (ref >59)
Globulin, Total: 2.2 g/dL (ref 1.5–4.5)
Glucose: 98 mg/dL (ref 65–99)
Potassium: 4.9 mmol/L (ref 3.5–5.2)
Sodium: 139 mmol/L (ref 134–144)
Total Protein: 6.4 g/dL (ref 6.0–8.5)

## 2018-10-28 LAB — TSH: TSH: 3.61 u[IU]/mL (ref 0.450–4.500)

## 2018-11-02 LAB — CYTOLOGY - PAP: Diagnosis: NEGATIVE

## 2018-11-24 ENCOUNTER — Encounter: Payer: Self-pay | Admitting: Family Medicine

## 2018-11-24 ENCOUNTER — Ambulatory Visit (INDEPENDENT_AMBULATORY_CARE_PROVIDER_SITE_OTHER): Payer: Medicare Other | Admitting: Family Medicine

## 2018-11-24 ENCOUNTER — Other Ambulatory Visit: Payer: Self-pay

## 2018-11-24 DIAGNOSIS — N952 Postmenopausal atrophic vaginitis: Secondary | ICD-10-CM | POA: Diagnosis not present

## 2018-11-24 DIAGNOSIS — B079 Viral wart, unspecified: Secondary | ICD-10-CM

## 2018-11-24 NOTE — Progress Notes (Signed)
There were no vitals taken for this visit.   Subjective:    Patient ID: Chelsea Hughes, female    DOB: 04/23/1950, 68 y.o.   MRN: [id number]  HPI: Chelsea Hughes is a 68 y.o. female  Chief Complaint  Patient presents with  . Follow-up    4 week f/up for hair loss and vaginal irritation   Had had some vaginal irritation. This has resolved since her last visit. Wart is feeling OK- has not gotten bigger and not particularly bothering her. She was happy to know that her labs all came back normal. She has not been having any burning or redness. No UTI symptoms. Generally feeling well. She notes that she had not been having hair growing on her legs as quickly and that it was more fine. She is totally OK with this and is otherwise feeling great. No other concerns or complaints at this time.   Relevant past medical, surgical, family and social history reviewed and updated as indicated. Interim medical history since our last visit reviewed. Allergies and medications reviewed and updated.  Review of Systems  Constitutional: Negative.   Respiratory: Negative.   Cardiovascular: Negative.   Genitourinary: Negative.  Negative for decreased urine volume, difficulty urinating, dyspareunia, dysuria, enuresis, flank pain, frequency, genital sores, hematuria, menstrual problem, pelvic pain, urgency, vaginal bleeding, vaginal discharge and vaginal pain.  Musculoskeletal: Negative.   Skin: Negative.   Neurological: Negative.   Psychiatric/Behavioral: Negative.     Per HPI unless specifically indicated above     Objective:    There were no vitals taken for this visit.  Wt Readings from Last 3 Encounters:  12/13/17 125 lb 8 oz (56.9 kg)  12/02/17 128 lb (58.1 kg)  11/30/16 125 lb 1 oz (56.7 kg)    Physical Exam Vitals signs and nursing note reviewed.  Pulmonary:     Effort: Pulmonary effort is normal. No respiratory distress.     Comments: Speaking in full sentences Neurological:   Mental Status: She is alert.  Psychiatric:        Mood and Affect: Mood normal.        Behavior: Behavior normal.        Thought Content: Thought content normal.        Judgment: Judgment normal.     Results for orders placed or performed in visit on 10/27/18  HSV(herpes simplex vrs) 1+2 ab-IgG  Result Value Ref Range   HSV 1 Glycoprotein G Ab, IgG 41.40 (H) 0.00 - 0.90 index   HSV 2 IgG, Type Spec <0.91 0.00 - 0.90 index  TSH  Result Value Ref Range   TSH 3.610 0.450 - 4.500 uIU/mL  CBC with Differential/Platelet  Result Value Ref Range   WBC 6.7 3.4 - 10.8 x10E3/uL   RBC 3.99 3.77 - 5.28 x10E6/uL   Hemoglobin 12.3 11.1 - 15.9 g/dL   Hematocrit 36.0 34.0 - 46.6 %   MCV 90 79 - 97 fL   MCH 30.8 26.6 - 33.0 pg   MCHC 34.2 31.5 - 35.7 g/dL   RDW 13.1 11.7 - 15.4 %   Platelets 269 150 - 450 x10E3/uL   Neutrophils 70 Not Estab. %   Lymphs 20 Not Estab. %   Monocytes 7 Not Estab. %   Eos 2 Not Estab. %   Basos 1 Not Estab. %   Neutrophils Absolute 4.7 1.4 - 7.0 x10E3/uL   Lymphocytes Absolute 1.4 0.7 - 3.1 x10E3/uL   Monocytes Absolute 0.5 0.1 -  0.9 x10E3/uL   EOS (ABSOLUTE) 0.1 0.0 - 0.4 x10E3/uL   Basophils Absolute 0.0 0.0 - 0.2 x10E3/uL   Immature Granulocytes 0 Not Estab. %   Immature Grans (Abs) 0.0 0.0 - 0.1 x10E3/uL  Comprehensive metabolic panel  Result Value Ref Range   Glucose 98 65 - 99 mg/dL   BUN 14 8 - 27 mg/dL   Creatinine, Ser 9.51 0.57 - 1.00 mg/dL   GFR calc non Af Amer 82 >59 mL/min/1.73   GFR calc Af Amer 94 >59 mL/min/1.73   BUN/Creatinine Ratio 19 12 - 28   Sodium 139 134 - 144 mmol/L   Potassium 4.9 3.5 - 5.2 mmol/L   Chloride 102 96 - 106 mmol/L   CO2 26 20 - 29 mmol/L   Calcium 9.8 8.7 - 10.3 mg/dL   Total Protein 6.4 6.0 - 8.5 g/dL   Albumin 4.2 3.8 - 4.8 g/dL   Globulin, Total 2.2 1.5 - 4.5 g/dL   Albumin/Globulin Ratio 1.9 1.2 - 2.2   Bilirubin Total 0.2 0.0 - 1.2 mg/dL   Alkaline Phosphatase 51 39 - 117 IU/L   AST 15 0 - 40 IU/L    ALT 10 0 - 32 IU/L  Cytology - PAP  Result Value Ref Range   Adequacy      Satisfactory for evaluation. The presence or absence of an   Adequacy      endocervical/transformation zone component cannot be determined because   Adequacy of atrophy.    Diagnosis      - Negative for intraepithelial lesion or malignancy (NILM)      Assessment & Plan:   Problem List Items Addressed This Visit    None    Visit Diagnoses    Viral warts, unspecified type    -  Primary   Reassured patient. Continue to monitor. Can discuss freezing if bothering her.    Vaginal atrophy       Consider topical estrogen if not improving. Feeling better now. Continue to monitor.        Follow up plan: Return As scheduled.   . This visit was completed via telephone due to the restrictions of the COVID-19 pandemic. All issues as above were discussed and addressed but no physical exam was performed. If it was felt that the patient should be evaluated in the office, they were directed there. The patient verbally consented to this visit. Patient was unable to complete an audio/visual visit due to Lack of equipment. Due to the catastrophic nature of the COVID-19 pandemic, this visit was done through audio contact only. . Location of the patient: home . Location of the provider: home . Those involved with this call:  . Provider: Olevia Perches, DO . CMA: Wilhemena Durie, CMA . Front Desk/Registration: Adela Ports  . Time spent on call: 21 minutes on the phone discussing health concerns. 23 minutes total spent in review of patient's record and preparation of their chart.

## 2018-12-05 ENCOUNTER — Ambulatory Visit (INDEPENDENT_AMBULATORY_CARE_PROVIDER_SITE_OTHER): Payer: Medicare Other

## 2018-12-05 VITALS — Ht 60.0 in | Wt 124.0 lb

## 2018-12-05 DIAGNOSIS — Z0000 Encounter for general adult medical examination without abnormal findings: Secondary | ICD-10-CM | POA: Diagnosis not present

## 2018-12-05 NOTE — Patient Instructions (Signed)
Chelsea Hughes , Thank you for taking time to come for your Medicare Wellness Visit. I appreciate your ongoing commitment to your health goals. Please review the following plan we discussed and let me know if I can assist you in the future.   Screening recommendations/referrals: Colonoscopy: cologuard completed 2019 Mammogram: completed 02/2018 Bone Density: completed 02/2017 Recommended yearly ophthalmology/optometry visit for glaucoma screening and checkup Recommended yearly dental visit for hygiene and checkup  Vaccinations: Influenza vaccine: up to date Pneumococcal vaccine: up to date Tdap vaccine: up to date Shingles vaccine: first dose of shingrix completed     Advanced directives: please let me know if you ave any questions or need any assistance with this paperwork   Conditions/risks identified: none   Next appointment: follow up in one year for your annual wellness visit    Preventive Care 42 Years and Older, Female Preventive care refers to lifestyle choices and visits with your health care provider that can promote health and wellness. What does preventive care include?  A yearly physical exam. This is also called an annual well check.  Dental exams once or twice a year.  Routine eye exams. Ask your health care provider how often you should have your eyes checked.  Personal lifestyle choices, including:  Daily care of your teeth and gums.  Regular physical activity.  Eating a healthy diet.  Avoiding tobacco and drug use.  Limiting alcohol use.  Practicing safe sex.  Taking low-dose aspirin every day.  Taking vitamin and mineral supplements as recommended by your health care provider. What happens during an annual well check? The services and screenings done by your health care provider during your annual well check will depend on your age, overall health, lifestyle risk factors, and family history of disease. Counseling  Your health care provider may ask you  questions about your:  Alcohol use.  Tobacco use.  Drug use.  Emotional well-being.  Home and relationship well-being.  Sexual activity.  Eating habits.  History of falls.  Memory and ability to understand (cognition).  Work and work Statistician.  Reproductive health. Screening  You may have the following tests or measurements:  Height, weight, and BMI.  Blood pressure.  Lipid and cholesterol levels. These may be checked every 5 years, or more frequently if you are over 30 years old.  Skin check.  Lung cancer screening. You may have this screening every year starting at age 24 if you have a 30-pack-year history of smoking and currently smoke or have quit within the past 15 years.  Fecal occult blood test (FOBT) of the stool. You may have this test every year starting at age 20.  Flexible sigmoidoscopy or colonoscopy. You may have a sigmoidoscopy every 5 years or a colonoscopy every 10 years starting at age 30.  Hepatitis C blood test.  Hepatitis B blood test.  Sexually transmitted disease (STD) testing.  Diabetes screening. This is done by checking your blood sugar (glucose) after you have not eaten for a while (fasting). You may have this done every 1-3 years.  Bone density scan. This is done to screen for osteoporosis. You may have this done starting at age 3.  Mammogram. This may be done every 1-2 years. Talk to your health care provider about how often you should have regular mammograms. Talk with your health care provider about your test results, treatment options, and if necessary, the need for more tests. Vaccines  Your health care provider may recommend certain vaccines, such as:  Influenza vaccine. This is recommended every year.  Tetanus, diphtheria, and acellular pertussis (Tdap, Td) vaccine. You may need a Td booster every 10 years.  Zoster vaccine. You may need this after age 19.  Pneumococcal 13-valent conjugate (PCV13) vaccine. One dose is  recommended after age 32.  Pneumococcal polysaccharide (PPSV23) vaccine. One dose is recommended after age 51. Talk to your health care provider about which screenings and vaccines you need and how often you need them. This information is not intended to replace advice given to you by your health care provider. Make sure you discuss any questions you have with your health care provider. Document Released: 02/01/2015 Document Revised: 09/25/2015 Document Reviewed: 11/06/2014 Elsevier Interactive Patient Education  2017 [place] Prevention in the Home Falls can cause injuries. They can happen to people of all ages. There are many things you can do to make your home safe and to help prevent falls. What can I do on the outside of my home?  Regularly fix the edges of walkways and driveways and fix any cracks.  Remove anything that might make you trip as you walk through a door, such as a raised step or threshold.  Trim any bushes or trees on the path to your home.  Use bright outdoor lighting.  Clear any walking paths of anything that might make someone trip, such as rocks or tools.  Regularly check to see if handrails are loose or broken. Make sure that both sides of any steps have handrails.  Any raised decks and porches should have guardrails on the edges.  Have any leaves, snow, or ice cleared regularly.  Use sand or salt on walking paths during winter.  Clean up any spills in your garage right away. This includes oil or grease spills. What can I do in the bathroom?  Use night lights.  Install grab bars by the toilet and in the tub and shower. Do not use towel bars as grab bars.  Use non-skid mats or decals in the tub or shower.  If you need to sit down in the shower, use a plastic, non-slip stool.  Keep the floor dry. Clean up any water that spills on the floor as soon as it happens.  Remove soap buildup in the tub or shower regularly.  Attach bath mats  securely with double-sided non-slip rug tape.  Do not have throw rugs and other things on the floor that can make you trip. What can I do in the bedroom?  Use night lights.  Make sure that you have a light by your bed that is easy to reach.  Do not use any sheets or blankets that are too big for your bed. They should not hang down onto the floor.  Have a firm chair that has side arms. You can use this for support while you get dressed.  Do not have throw rugs and other things on the floor that can make you trip. What can I do in the kitchen?  Clean up any spills right away.  Avoid walking on wet floors.  Keep items that you use a lot in easy-to-reach places.  If you need to reach something above you, use a strong step stool that has a grab bar.  Keep electrical cords out of the way.  Do not use floor polish or wax that makes floors slippery. If you must use wax, use non-skid floor wax.  Do not have throw rugs and other things on the floor that  can make you trip. What can I do with my stairs?  Do not leave any items on the stairs.  Make sure that there are handrails on both sides of the stairs and use them. Fix handrails that are broken or loose. Make sure that handrails are as long as the stairways.  Check any carpeting to make sure that it is firmly attached to the stairs. Fix any carpet that is loose or worn.  Avoid having throw rugs at the top or bottom of the stairs. If you do have throw rugs, attach them to the floor with carpet tape.  Make sure that you have a light switch at the top of the stairs and the bottom of the stairs. If you do not have them, ask someone to add them for you. What else can I do to help prevent falls?  Wear shoes that:  Do not have high heels.  Have rubber bottoms.  Are comfortable and fit you well.  Are closed at the toe. Do not wear sandals.  If you use a stepladder:  Make sure that it is fully opened. Do not climb a closed  stepladder.  Make sure that both sides of the stepladder are locked into place.  Ask someone to hold it for you, if possible.  Clearly mark and make sure that you can see:  Any grab bars or handrails.  First and last steps.  Where the edge of each step is.  Use tools that help you move around (mobility aids) if they are needed. These include:  Canes.  Walkers.  Scooters.  Crutches.  Turn on the lights when you go into a dark area. Replace any light bulbs as soon as they burn out.  Set up your furniture so you have a clear path. Avoid moving your furniture around.  If any of your floors are uneven, fix them.  If there are any pets around you, be aware of where they are.  Review your medicines with your doctor. Some medicines can make you feel dizzy. This can increase your chance of falling. Ask your doctor what other things that you can do to help prevent falls. This information is not intended to replace advice given to you by your health care provider. Make sure you discuss any questions you have with your health care provider. Document Released: 11/01/2008 Document Revised: 06/13/2015 Document Reviewed: 02/09/2014 Elsevier Interactive Patient Education  2017 [employer].

## 2018-12-05 NOTE — Progress Notes (Signed)
Subjective:   Chelsea Hughes is a 68 y.o. female who presents for Medicare Annual (Subsequent) preventive examination.  Review of Systems:   Cardiac Risk Factors include: advanced age (>7555men, 80>65 women)     Objective:     Vitals: Wt 124 lb (56.2 kg)   BMI 24.22 kg/m   Body mass index is 24.22 kg/m.  Advanced Directives 12/05/2018 12/02/2017 11/29/2015  Does Patient Have a Medical Advance Directive? No No No  Would patient like information on creating a medical advance directive? - Yes (MAU/Ambulatory/Procedural Areas - Information given) Yes - Educational materials given    Tobacco Social History   Tobacco Use  Smoking Status Never Smoker  Smokeless Tobacco Never Used     Counseling given: Not Answered   Clinical Intake:  Pre-visit preparation completed: Yes  Pain : No/denies pain     Nutritional Status: BMI of 19-24  Normal Nutritional Risks: None Diabetes: No  How often do you need to have someone help you when you read instructions, pamphlets, or other written materials from your doctor or pharmacy?: 1 - Never  Interpreter Needed?: No  Information entered by :: Tiffany Hill,LPN  Past Medical History:  Diagnosis Date  . Atypical squamous cell changes of undetermined significance (ASCUS) on cervical cytology with positive high risk human papilloma virus (HPV)   . Diverticulosis    on Colonoscopy 2007; CT scan 2014  . Microscopic hematuria    evaluated by Urology in 2014, abd/pelvis CT scan Dec 2014  . Osteoporosis    History reviewed. No pertinent surgical history. Family History  Problem Relation Age of Onset  . Heart disease Mother        CABG  . Hypertension Mother   . Osteoporosis Mother   . Thyroid disease Mother   . Heart disease Father   . Hypertension Father   . Cancer Father        throat (smoker)  . COPD Neg Hx   . Diabetes Neg Hx   . Stroke Neg Hx   . Breast cancer Neg Hx    Social History   Socioeconomic History  .  Marital status: Widowed    Spouse name: Not on file  . Number of children: Not on file  . Years of education: Not on file  . Highest education level: Not on file  Occupational History  . Not on file  Social Needs  . Financial resource strain: Not hard at all  . Food insecurity    Worry: Never true    Inability: Never true  . Transportation needs    Medical: No    Non-medical: No  Tobacco Use  . Smoking status: Never Smoker  . Smokeless tobacco: Never Used  Substance and Sexual Activity  . Alcohol use: Yes    Alcohol/week: 7.0 standard drinks    Types: 7 Glasses of wine per week    Comment: wine  . Drug use: No  . Sexual activity: Not Currently  Lifestyle  . Physical activity    Days per week: 5 days    Minutes per session: 40 min  . Stress: Only a little  Relationships  . Social connections    Talks on phone: More than three times a week    Gets together: More than three times a week    Attends religious service: More than 4 times per year    Active member of club or organization: Yes    Attends meetings of clubs or organizations: More than  4 times per year    Relationship status: Widowed  Other Topics Concern  . Not on file  Social History Narrative  . Not on file    Outpatient Encounter Medications as of 12/05/2018  Medication Sig  . aspirin EC 81 MG tablet Take 81 mg by mouth daily.  . Biotin 89381 MCG TABS Take by mouth.  . Calcium Carbonate (CALCIUM 600 PO) Take 600 mg by mouth 2 (two) times daily.  . Flaxseed, Linseed, (FLAXSEED OIL) 1000 MG CAPS Take 1,000 mg by mouth daily.  Marland Kitchen loratadine (CLARITIN) 10 MG tablet Take 10 mg by mouth daily.  . Multiple Vitamin (MULTIVITAMIN) tablet Take 1 tablet by mouth daily.  . Omega-3 Fatty Acids (FISH OIL) 1200 MG CAPS Take 1,200 mg by mouth daily.  . TURMERIC PO Take by mouth.   No facility-administered encounter medications on file as of 12/05/2018.     Activities of Daily Living In your present state of health,  do you have any difficulty performing the following activities: 12/05/2018  Hearing? N  Comment no hearing aids  Vision? N  Comment reading glasses, Dr.Woodard as needed  Difficulty concentrating or making decisions? N  Walking or climbing stairs? N  Dressing or bathing? N  Doing errands, shopping? N  Preparing Food and eating ? N  Using the Toilet? N  In the past six months, have you accidently leaked urine? N  Do you have problems with loss of bowel control? N  Managing your Medications? N  Managing your Finances? N  Housekeeping or managing your Housekeeping? N  Some recent data might be hidden    Patient Care Team: Dorcas Carrow, DO as PCP - General (Family Medicine) Nancy Marus, MD (Dermatology)    Assessment:   This is a routine wellness examination for [name].  Exercise Activities and Dietary recommendations Current Exercise Habits: Home exercise routine, Type of exercise: walking, Time (Minutes): 40, Frequency (Times/Week): 5, Weekly Exercise (Minutes/Week): 200, Intensity: Mild, Exercise limited by: None identified  Goals   None     Fall Risk: Fall Risk  12/05/2018 12/02/2017 11/29/2015 11/12/2014  Falls in the past year? 0 0 No No  Number falls in past yr: 0 0 - -  Injury with Fall? 0 0 - -    FALL RISK PREVENTION PERTAINING TO THE HOME:  Any stairs in or around the home? Yes  If so, are there any without handrails? No   Home free of loose throw rugs in walkways, pet beds, electrical cords, etc? Yes  Adequate lighting in your home to reduce risk of falls? Yes   ASSISTIVE DEVICES UTILIZED TO PREVENT FALLS:  Life alert? No  Use of a cane, walker or w/c? No  Grab bars in the bathroom? Yes  Shower chair or bench in shower? No  Elevated toilet seat or a handicapped toilet? No   DME ORDERS:  DME order needed?  No   TIMED UP AND GO:  Unable to perform    Depression Screen PHQ 2/9 Scores 12/05/2018 12/02/2017 11/30/2016 11/29/2015  PHQ -  2 Score 0 0 0 0  PHQ- 9 Score - - 0 -     Cognitive Function     6CIT Screen 12/02/2017 11/30/2016 11/29/2015  What Year? 0 points 0 points 0 points  What month? 0 points 0 points 0 points  What time? 0 points 0 points 0 points  Count back from 20 0 points 0 points 0 points  Months in reverse 0  points - 0 points  Repeat phrase 0 points 0 points 4 points  Total Score 0 - 4    Immunization History  Administered Date(s) Administered  . Influenza, High Dose Seasonal PF 11/06/2017, 09/19/2018  . Influenza-Unspecified 09/20/2014, 10/16/2015, 11/09/2016  . Pneumococcal Conjugate-13 01/01/2014  . Pneumococcal Polysaccharide-23 11/29/2015  . Td 08/07/2004  . Tdap 09/29/2010  . Zoster 02/20/2010  . Zoster Recombinat (Shingrix) 11/12/2018    Qualifies for Shingles Vaccine? yes,   Tdap: up to date   Flu Vaccine: up to date   Pneumococcal Vaccine: up to date   Screening Tests Health Maintenance  Topic Date Due  . DEXA SCAN  02/26/2020  . MAMMOGRAM  02/29/2020  . TETANUS/TDAP  09/28/2020  . Fecal DNA (Cologuard)  01/06/2021  . INFLUENZA VACCINE  Completed  . Hepatitis C Screening  Completed  . PNA vac Low Risk Adult  Completed    Cancer Screenings:  Colorectal Screening: Completed cologuard 01/06/2018, due 2022  Mammogram: Completed 02/28/2018  Bone Density: Completed 02/25/2017  Lung Cancer Screening: (Low Dose CT Chest recommended if Age 3-80 years, 30 pack-year currently smoking OR have quit w/in 15years.) does not qualify.    Additional Screening:  Hepatitis C Screening: does qualify; Completed 11/29/2015  Vision Screening: Recommended annual ophthalmology exams for early detection of glaucoma and other disorders of the eye. Is the patient up to date with their annual eye exam?  No. Who is the provider or what is the name of the office in which the pt attends annual eye exams? Dr.Woodard  Dental Screening: Recommended annual dental exams for proper oral hygiene   Community Resource Referral:  CRR required this visit?  No       Plan:  I have personally reviewed and addressed the Medicare Annual Wellness questionnaire and have noted the following in the patient's chart:  A. Medical and social history B. Use of alcohol, tobacco or illicit drugs  C. Current medications and supplements D. Functional ability and status E.  Nutritional status F.  Physical activity G. Advance directives H. List of other physicians I.  Hospitalizations, surgeries, and ER visits in previous 12 months J.  Charlo such as hearing and vision if needed, cognitive and depression L. Referrals and appointments   In addition, I have reviewed and discussed with patient certain preventive protocols, quality metrics, and best practice recommendations. A written personalized care plan for preventive services as well as general preventive health recommendations were provided to patient.  Signed,    Bevelyn Ngo, LPN  [id number] Nurse Health Advisor   Nurse Notes: none

## 2018-12-19 ENCOUNTER — Other Ambulatory Visit: Payer: Self-pay

## 2018-12-19 ENCOUNTER — Encounter: Payer: Self-pay | Admitting: Family Medicine

## 2018-12-19 ENCOUNTER — Ambulatory Visit (INDEPENDENT_AMBULATORY_CARE_PROVIDER_SITE_OTHER): Payer: Medicare Other | Admitting: Family Medicine

## 2018-12-19 VITALS — BP 151/95 | HR 67 | Temp 98.4°F | Ht 59.65 in | Wt 124.2 lb

## 2018-12-19 DIAGNOSIS — Z0000 Encounter for general adult medical examination without abnormal findings: Secondary | ICD-10-CM | POA: Diagnosis not present

## 2018-12-19 DIAGNOSIS — E782 Mixed hyperlipidemia: Secondary | ICD-10-CM | POA: Diagnosis not present

## 2018-12-19 DIAGNOSIS — R030 Elevated blood-pressure reading, without diagnosis of hypertension: Secondary | ICD-10-CM

## 2018-12-19 LAB — MICROALBUMIN, URINE WAIVED
Creatinine, Urine Waived: 50 mg/dL (ref 10–300)
Microalb, Ur Waived: 10 mg/L (ref 0–19)

## 2018-12-19 LAB — UA/M W/RFLX CULTURE, ROUTINE
Bilirubin, UA: NEGATIVE
Glucose, UA: NEGATIVE
Ketones, UA: NEGATIVE
Leukocytes,UA: NEGATIVE
Nitrite, UA: NEGATIVE
Protein,UA: NEGATIVE
Specific Gravity, UA: 1.01 (ref 1.005–1.030)
Urobilinogen, Ur: 0.2 mg/dL (ref 0.2–1.0)
pH, UA: 6 (ref 5.0–7.5)

## 2018-12-19 LAB — MICROSCOPIC EXAMINATION
Bacteria, UA: NONE SEEN
WBC, UA: NONE SEEN /hpf (ref 0–5)

## 2018-12-19 NOTE — Assessment & Plan Note (Signed)
Rechecking levels today. Await results. Call with any concerns.  

## 2018-12-19 NOTE — Progress Notes (Signed)
BP (!) 151/95   Pulse 67   Temp 98.4 F (36.9 C)   Ht 4' 11.65" (1.515 m)   Wt 124 lb 4 oz (56.4 kg)   SpO2 100%   BMI 24.55 kg/m    Subjective:    Patient ID: Chelsea Hughes, female    DOB: [birth year], 68 y.o.   MRN: [id number]  HPI: Chelsea Hughes is a 68 y.o. female presenting on 12/19/2018 for comprehensive medical examination. Current medical complaints include:  HYPERLIPIDEMIA Hyperlipidemia status: stable Satisfied with current treatment?  yes Side effects:  no Past cholesterol meds: fish oil Supplements: fish oil Aspirin:  yes The 10-year ASCVD risk score Chelsea George DC Jr., et al., 2013) is: 12.4%   Values used to calculate the score:     Age: 10 years     Sex: Female     Is Non-Hispanic African American: No     Diabetic: No     Tobacco smoker: No     Systolic Blood Pressure: 151 mmHg     Is BP treated: No     HDL Cholesterol: 67 mg/dL     Total Cholesterol: 198 mg/dL Chest pain:  no Coronary artery disease:  no  Menopausal Symptoms: no  Depression Screen done today and results listed below:  Depression screen [facility] 2/9 12/19/2018 12/05/2018 12/02/2017 11/30/2016 11/29/2015  Decreased Interest 0 0 0 0 0  Down, Depressed, Hopeless 0 0 0 0 0  PHQ - 2 Score 0 0 0 0 0  Altered sleeping - - - 0 -  Tired, decreased energy - - - 0 -  Change in appetite - - - 0 -  Feeling bad or failure about yourself  - - - 0 -  Trouble concentrating - - - 0 -  Moving slowly or fidgety/restless - - - 0 -  Suicidal thoughts - - - 0 -  PHQ-9 Score - - - 0 -  Difficult doing work/chores - - - Not difficult at all -    Past Medical History:  Past Medical History:  Diagnosis Date  . Atypical squamous cell changes of undetermined significance (ASCUS) on cervical cytology with positive high risk human papilloma virus (HPV)   . Diverticulosis    on Colonoscopy 2007; CT scan 2014  . Microscopic hematuria    evaluated by Urology in 2014, abd/pelvis CT scan Dec 2014  . Osteoporosis      Surgical History:  History reviewed. No pertinent surgical history.  Medications:  Current Outpatient Medications on File Prior to Visit  Medication Sig  . aspirin EC 81 MG tablet Take 81 mg by mouth daily.  . Biotin 40981 MCG TABS Take by mouth.  . Calcium Carbonate (CALCIUM 600 PO) Take 600 mg by mouth 2 (two) times daily.  . Flaxseed, Linseed, (FLAXSEED OIL) 1000 MG CAPS Take 1,000 mg by mouth daily.  Marland Kitchen loratadine (CLARITIN) 10 MG tablet Take 10 mg by mouth daily.  . Multiple Vitamin (MULTIVITAMIN) tablet Take 1 tablet by mouth daily.  . Omega-3 Fatty Acids (FISH OIL) 1200 MG CAPS Take 1,200 mg by mouth daily.  . TURMERIC PO Take by mouth.   No current facility-administered medications on file prior to visit.     Allergies:  No Known Allergies  Social History:  Social History   Socioeconomic History  . Marital status: Widowed    Spouse name: Not on file  . Number of children: Not on file  . Years of education: Not on file  .  Highest education level: Not on file  Occupational History  . Not on file  Social Needs  . Financial resource strain: Not hard at all  . Food insecurity    Worry: Never true    Inability: Never true  . Transportation needs    Medical: No    Non-medical: No  Tobacco Use  . Smoking status: Never Smoker  . Smokeless tobacco: Never Used  Substance and Sexual Activity  . Alcohol use: Yes    Alcohol/week: 7.0 standard drinks    Types: 7 Glasses of wine per week    Comment: wine  . Drug use: No  . Sexual activity: Not Currently  Lifestyle  . Physical activity    Days per week: 5 days    Minutes per session: 40 min  . Stress: Only a little  Relationships  . Social connections    Talks on phone: More than three times a week    Gets together: More than three times a week    Attends religious service: More than 4 times per year    Active member of club or organization: Yes    Attends meetings of clubs or organizations: More than 4 times  per year    Relationship status: Widowed  . Intimate partner violence    Fear of current or ex partner: No    Emotionally abused: No    Physically abused: No    Forced sexual activity: No  Other Topics Concern  . Not on file  Social History Narrative  . Not on file   Social History   Tobacco Use  Smoking Status Never Smoker  Smokeless Tobacco Never Used   Social History   Substance and Sexual Activity  Alcohol Use Yes  . Alcohol/week: 7.0 standard drinks  . Types: 7 Glasses of wine per week   Comment: wine    Family History:  Family History  Problem Relation Age of Onset  . Heart disease Mother        CABG  . Hypertension Mother   . Osteoporosis Mother   . Thyroid disease Mother   . Heart disease Father   . Hypertension Father   . Cancer Father        throat (smoker)  . COPD Neg Hx   . Diabetes Neg Hx   . Stroke Neg Hx   . Breast cancer Neg Hx     Past medical history, surgical history, medications, allergies, family history and social history reviewed with patient today and changes made to appropriate areas of the chart.   Review of Systems  Constitutional: Negative.   HENT: Negative.   Eyes: Negative.   Respiratory: Negative.   Cardiovascular: Negative.   Gastrointestinal: Negative.   Genitourinary: Negative.   Musculoskeletal: Negative.   Skin: Negative.   Neurological: Negative.   Endo/Heme/Allergies: Positive for environmental allergies. Negative for polydipsia. Does not bruise/bleed easily.  Psychiatric/Behavioral: Negative.     All other ROS negative except what is listed above and in the HPI.      Objective:    BP (!) 151/95   Pulse 67   Temp 98.4 F (36.9 C)   Ht 4' 11.65" (1.515 m)   Wt 124 lb 4 oz (56.4 kg)   SpO2 100%   BMI 24.55 kg/m   Wt Readings from Last 3 Encounters:  12/19/18 124 lb 4 oz (56.4 kg)  12/05/18 124 lb (56.2 kg)  12/13/17 125 lb 8 oz (56.9 kg)    Physical Exam Vitals  signs and nursing note reviewed.   Constitutional:      General: She is not in acute distress.    Appearance: Normal appearance. She is not ill-appearing, toxic-appearing or diaphoretic.  HENT:     Head: Normocephalic and atraumatic.     Right Ear: Tympanic membrane, ear canal and external ear normal. There is no impacted cerumen.     Left Ear: Tympanic membrane, ear canal and external ear normal. There is no impacted cerumen.     Nose: Nose normal. No congestion or rhinorrhea.     Mouth/Throat:     Mouth: Mucous membranes are moist.     Pharynx: Oropharynx is clear. No oropharyngeal exudate or posterior oropharyngeal erythema.  Eyes:     General: No scleral icterus.       Right eye: No discharge.        Left eye: No discharge.     Extraocular Movements: Extraocular movements intact.     Conjunctiva/sclera: Conjunctivae normal.     Pupils: Pupils are equal, round, and reactive to light.  Neck:     Musculoskeletal: Normal range of motion and neck supple. No neck rigidity or muscular tenderness.     Vascular: No carotid bruit.  Cardiovascular:     Rate and Rhythm: Normal rate and regular rhythm.     Pulses: Normal pulses.     Heart sounds: No murmur. No friction rub. No gallop.   Pulmonary:     Effort: Pulmonary effort is normal. No respiratory distress.     Breath sounds: Normal breath sounds. No stridor. No wheezing, rhonchi or rales.  Chest:     Chest wall: No tenderness.  Abdominal:     General: Abdomen is flat. Bowel sounds are normal. There is no distension.     Palpations: Abdomen is soft. There is no mass.     Tenderness: There is no abdominal tenderness. There is no right CVA tenderness, left CVA tenderness, guarding or rebound.     Hernia: No hernia is present.  Genitourinary:    Comments: Breast and pelvic exams deferred with shared decision making Musculoskeletal:        General: No swelling, tenderness, deformity or signs of injury.     Right lower leg: No edema.     Left lower leg: No edema.   Lymphadenopathy:     Cervical: No cervical adenopathy.  Skin:    General: Skin is warm and dry.     Capillary Refill: Capillary refill takes less than 2 seconds.     Coloration: Skin is not jaundiced or pale.     Findings: No bruising, erythema, lesion or rash.  Neurological:     General: No focal deficit present.     Mental Status: She is alert and oriented to person, place, and time. Mental status is at baseline.     Cranial Nerves: No cranial nerve deficit.     Sensory: No sensory deficit.     Motor: No weakness.     Coordination: Coordination normal.     Gait: Gait normal.     Deep Tendon Reflexes: Reflexes normal.  Psychiatric:        Mood and Affect: Mood normal.        Behavior: Behavior normal.        Thought Content: Thought content normal.        Judgment: Judgment normal.     Results for orders placed or performed in visit on 10/27/18  HSV(herpes simplex vrs) 1+2 ab-IgG  Result  Value Ref Range   HSV 1 Glycoprotein G Ab, IgG 41.40 (H) 0.00 - 0.90 index   HSV 2 IgG, Type Spec <0.91 0.00 - 0.90 index  TSH  Result Value Ref Range   TSH 3.610 0.450 - 4.500 uIU/mL  CBC with Differential/Platelet  Result Value Ref Range   WBC 6.7 3.4 - 10.8 x10E3/uL   RBC 3.99 3.77 - 5.28 x10E6/uL   Hemoglobin 12.3 11.1 - 15.9 g/dL   Hematocrit 86.7 67.2 - 46.6 %   MCV 90 79 - 97 fL   MCH 30.8 26.6 - 33.0 pg   MCHC 34.2 31.5 - 35.7 g/dL   RDW 09.4 70.9 - 62.8 %   Platelets 269 150 - 450 x10E3/uL   Neutrophils 70 Not Estab. %   Lymphs 20 Not Estab. %   Monocytes 7 Not Estab. %   Eos 2 Not Estab. %   Basos 1 Not Estab. %   Neutrophils Absolute 4.7 1.4 - 7.0 x10E3/uL   Lymphocytes Absolute 1.4 0.7 - 3.1 x10E3/uL   Monocytes Absolute 0.5 0.1 - 0.9 x10E3/uL   EOS (ABSOLUTE) 0.1 0.0 - 0.4 x10E3/uL   Basophils Absolute 0.0 0.0 - 0.2 x10E3/uL   Immature Granulocytes 0 Not Estab. %   Immature Grans (Abs) 0.0 0.0 - 0.1 x10E3/uL  Comprehensive metabolic panel  Result Value Ref Range    Glucose 98 65 - 99 mg/dL   BUN 14 8 - 27 mg/dL   Creatinine, Ser 3.66 0.57 - 1.00 mg/dL   GFR calc non Af Amer 82 >59 mL/min/1.73   GFR calc Af Amer 94 >59 mL/min/1.73   BUN/Creatinine Ratio 19 12 - 28   Sodium 139 134 - 144 mmol/L   Potassium 4.9 3.5 - 5.2 mmol/L   Chloride 102 96 - 106 mmol/L   CO2 26 20 - 29 mmol/L   Calcium 9.8 8.7 - 10.3 mg/dL   Total Protein 6.4 6.0 - 8.5 g/dL   Albumin 4.2 3.8 - 4.8 g/dL   Globulin, Total 2.2 1.5 - 4.5 g/dL   Albumin/Globulin Ratio 1.9 1.2 - 2.2   Bilirubin Total 0.2 0.0 - 1.2 mg/dL   Alkaline Phosphatase 51 39 - 117 IU/L   AST 15 0 - 40 IU/L   ALT 10 0 - 32 IU/L  Cytology - PAP  Result Value Ref Range   Adequacy      Satisfactory for evaluation. The presence or absence of an   Adequacy      endocervical/transformation zone component cannot be determined because   Adequacy of atrophy.    Diagnosis      - Negative for intraepithelial lesion or malignancy (NILM)      Assessment & Plan:   Problem List Items Addressed This Visit      Other   Hyperlipidemia    Rechecking levels today. Await results. Call with any concerns.       Relevant Orders   Lipid Panel w/o Chol/HDL Ratio OUT    Other Visit Diagnoses    Routine general medical examination at a health care facility    -  Primary   Vaccines up to date. Screening labs checked last visit, cholesterol checked today. Continue diet and exercise. Pap, colon and mammogram up to date.    Elevated blood pressure reading       Will work on [employer] and recheck 4-6 weeks. Call with any concerns.    Relevant Orders   Microalbumin, Urine Waived   UA/M w/rflx Culture, Routine  Follow up plan: Return 4-6 weeks.   LABORATORY TESTING:  - Pap smear: up to date  IMMUNIZATIONS:   - Tdap: Tetanus vaccination status reviewed: last tetanus booster within 10 years. - Influenza: Up to date - Pneumovax: Up to date - Prevnar: Up to date - Zostavax vaccine: Up to date  SCREENING:  -Mammogram: Up to date  - Colonoscopy: Up to date  - Bone Density: Up to date   PATIENT COUNSELING:   Advised to take 1 mg of folate supplement per day if capable of pregnancy.   Sexuality: Discussed sexually transmitted diseases, partner selection, use of condoms, avoidance of unintended pregnancy  and contraceptive alternatives.   Advised to avoid cigarette smoking.  I discussed with the patient that most people either abstain from alcohol or drink within safe limits (<=14/week and <=4 drinks/occasion for males, <=7/weeks and <= 3 drinks/occasion for females) and that the risk for alcohol disorders and other health effects rises proportionally with the number of drinks per week and how often a drinker exceeds daily limits.  Discussed cessation/primary prevention of drug use and availability of treatment for abuse.   Diet: Encouraged to adjust caloric intake to maintain  or achieve ideal body weight, to reduce intake of dietary saturated fat and total fat, to limit sodium intake by avoiding high sodium foods and not adding table salt, and to maintain adequate dietary potassium and calcium preferably from fresh fruits, vegetables, and low-fat dairy products.    stressed the importance of regular exercise  Injury prevention: Discussed safety belts, safety helmets, smoke detector, smoking near bedding or upholstery.   Dental health: Discussed importance of regular tooth brushing, flossing, and dental visits.    NEXT PREVENTATIVE PHYSICAL DUE IN 1 YEAR. Return 4-6 weeks.

## 2018-12-19 NOTE — Patient Instructions (Signed)

## 2018-12-20 LAB — LIPID PANEL W/O CHOL/HDL RATIO
Cholesterol, Total: 212 mg/dL — ABNORMAL HIGH (ref 100–199)
HDL: 59 mg/dL
LDL Chol Calc (NIH): 116 mg/dL — ABNORMAL HIGH (ref 0–99)
Triglycerides: 212 mg/dL — ABNORMAL HIGH (ref 0–149)
VLDL Cholesterol Cal: 37 mg/dL (ref 5–40)

## 2018-12-23 ENCOUNTER — Telehealth: Payer: Self-pay | Admitting: Family Medicine

## 2018-12-23 NOTE — Telephone Encounter (Signed)
Patient notified

## 2018-12-23 NOTE — Telephone Encounter (Signed)
Please let her know that her cholesterol got a little worse, but not terrible- we'll recheck it when she comes in. Thanks!

## 2019-01-30 ENCOUNTER — Other Ambulatory Visit: Payer: Self-pay

## 2019-01-30 ENCOUNTER — Ambulatory Visit: Payer: Medicare PPO | Admitting: Family Medicine

## 2019-01-30 ENCOUNTER — Encounter: Payer: Self-pay | Admitting: Family Medicine

## 2019-01-30 VITALS — BP 132/72 | HR 78 | Temp 98.3°F

## 2019-01-30 DIAGNOSIS — R030 Elevated blood-pressure reading, without diagnosis of hypertension: Secondary | ICD-10-CM

## 2019-01-30 DIAGNOSIS — N898 Other specified noninflammatory disorders of vagina: Secondary | ICD-10-CM

## 2019-01-30 NOTE — Progress Notes (Signed)
BP 132/72   Pulse 78   Temp 98.3 F (36.8 C)   SpO2 100%    Subjective:    Patient ID: Chelsea Hughes, female    DOB: 1950-07-14, 69 y.o.   MRN: [id number]  HPI: Chelsea Hughes is a 69 y.o. female  Chief Complaint  Patient presents with  . Hypertension   ELEVATED BLOOD PRESSURE Duration of elevated BP: couple of months BP monitoring frequency: not checking BP range:  Previous BP meds: no Recent stressors: no Family history of hypertension: yes Recurrent headaches: no Visual changes: no Palpitations: no  Dyspnea: no Chest pain: no Lower extremity edema: no Dizzy/lightheaded: no Transient ischemic attacks: no  Relevant past medical, surgical, family and social history reviewed and updated as indicated. Interim medical history since our last visit reviewed. Allergies and medications reviewed and updated.  Review of Systems  Constitutional: Negative.   Respiratory: Negative.   Cardiovascular: Negative.   Musculoskeletal: Negative.   Neurological: Negative.   Psychiatric/Behavioral: Negative.     Per HPI unless specifically indicated above     Objective:    BP 132/72   Pulse 78   Temp 98.3 F (36.8 C)   SpO2 100%   Wt Readings from Last 3 Encounters:  12/19/18 124 lb 4 oz (56.4 kg)  12/05/18 124 lb (56.2 kg)  12/13/17 125 lb 8 oz (56.9 kg)    Physical Exam Vitals and nursing note reviewed.  Constitutional:      General: She is not in acute distress.    Appearance: Normal appearance. She is not ill-appearing, toxic-appearing or diaphoretic.  HENT:     Head: Normocephalic and atraumatic.     Right Ear: External ear normal.     Left Ear: External ear normal.     Nose: Nose normal.     Mouth/Throat:     Mouth: Mucous membranes are moist.     Pharynx: Oropharynx is clear.  Eyes:     General: No scleral icterus.       Right eye: No discharge.        Left eye: No discharge.     Extraocular Movements: Extraocular movements intact.   Conjunctiva/sclera: Conjunctivae normal.     Pupils: Pupils are equal, round, and reactive to light.  Cardiovascular:     Rate and Rhythm: Normal rate and regular rhythm.     Pulses: Normal pulses.     Heart sounds: Normal heart sounds. No murmur. No friction rub. No gallop.   Pulmonary:     Effort: Pulmonary effort is normal. No respiratory distress.     Breath sounds: Normal breath sounds. No stridor. No wheezing, rhonchi or rales.  Chest:     Chest wall: No tenderness.  Musculoskeletal:        General: Normal range of motion.     Cervical back: Normal range of motion and neck supple.  Skin:    General: Skin is warm and dry.     Capillary Refill: Capillary refill takes less than 2 seconds.     Coloration: Skin is not jaundiced or pale.     Findings: No bruising, erythema, lesion or rash.  Neurological:     General: No focal deficit present.     Mental Status: She is alert and oriented to person, place, and time. Mental status is at baseline.  Psychiatric:        Mood and Affect: Mood normal.        Behavior: Behavior normal.  Thought Content: Thought content normal.        Judgment: Judgment normal.     Results for orders placed or performed in visit on 12/19/18  Microscopic Examination  Result Value Ref Range   WBC, UA None seen 0 - 5 /hpf   RBC 3-10 (A) 0 - 2 /hpf   Epithelial Cells (non renal) 0-10 0 - 10 /hpf   Bacteria, UA None seen None seen/Few  Lipid Panel w/o Chol/HDL Ratio OUT  Result Value Ref Range   Cholesterol, Total 212 (H) 100 - 199 mg/dL   Triglycerides 212 (H) 0 - 149 mg/dL   HDL 59 >39 mg/dL   VLDL Cholesterol Cal 37 5 - 40 mg/dL   LDL Chol Calc (NIH) 116 (H) 0 - 99 mg/dL  Microalbumin, Urine Waived  Result Value Ref Range   Microalb, Ur Waived 10 0 - 19 mg/L   Creatinine, Urine Waived 50 10 - 300 mg/dL   Microalb/Creat Ratio 30-300 (H) <30 mg/g  UA/M w/rflx Culture, Routine   Specimen: Urine   URINE  Result Value Ref Range   Specific  Gravity, UA 1.010 1.005 - 1.030   pH, UA 6.0 5.0 - 7.5   Color, UA Yellow Yellow   Appearance Ur Clear Clear   Leukocytes,UA Negative Negative   Protein,UA Negative Negative/Trace   Glucose, UA Negative Negative   Ketones, UA Negative Negative   RBC, UA 1+ (A) Negative   Bilirubin, UA Negative Negative   Urobilinogen, Ur 0.2 0.2 - 1.0 mg/dL   Nitrite, UA Negative Negative   Microscopic Examination See below:       Assessment & Plan:   Problem List Items Addressed This Visit    None    Visit Diagnoses    Elevated blood pressure reading    -  Primary   Back to normal. Continue DASH diet. Continue to monitor. Recheck at 6 month visit.    Vaginal itching       Discussed vaginal care. If not getting better or getting worse, let us know.        Follow up plan: Return End on May/June.

## 2019-01-31 ENCOUNTER — Other Ambulatory Visit: Payer: Self-pay | Admitting: Family Medicine

## 2019-01-31 DIAGNOSIS — Z1231 Encounter for screening mammogram for malignant neoplasm of breast: Secondary | ICD-10-CM

## 2019-03-02 ENCOUNTER — Ambulatory Visit
Admission: RE | Admit: 2019-03-02 | Discharge: 2019-03-02 | Disposition: A | Discharge status: Home / Self Care | Payer: Medicare PPO | Source: Ambulatory Visit | Attending: Family Medicine | Admitting: Family Medicine

## 2019-03-02 DIAGNOSIS — Z1231 Encounter for screening mammogram for malignant neoplasm of breast: Secondary | ICD-10-CM | POA: Diagnosis not present

## 2019-03-09 ENCOUNTER — Encounter: Payer: Self-pay | Admitting: Family Medicine

## 2019-04-07 ENCOUNTER — Ambulatory Visit (INDEPENDENT_AMBULATORY_CARE_PROVIDER_SITE_OTHER): Payer: Medicare PPO | Admitting: Nurse Practitioner

## 2019-04-07 ENCOUNTER — Other Ambulatory Visit: Payer: Self-pay

## 2019-04-07 ENCOUNTER — Encounter: Payer: Self-pay | Admitting: Nurse Practitioner

## 2019-04-07 VITALS — BP 144/79 | HR 64 | Temp 98.5°F | Ht 60.0 in | Wt 123.0 lb

## 2019-04-07 DIAGNOSIS — M542 Cervicalgia: Secondary | ICD-10-CM | POA: Diagnosis not present

## 2019-04-07 DIAGNOSIS — H9201 Otalgia, right ear: Secondary | ICD-10-CM

## 2019-04-07 MED ORDER — KETOROLAC TROMETHAMINE 60 MG/2ML IM SOLN
30.0000 mg | Freq: Once | INTRAMUSCULAR | Status: AC
  Administered 2019-04-07: 30 mg via INTRAMUSCULAR

## 2019-04-07 MED ORDER — FLUTICASONE PROPIONATE 50 MCG/ACT NA SUSP: 2.0000 | Freq: Every day | NASAL | 3 refills | Status: DC

## 2019-04-07 NOTE — Assessment & Plan Note (Addendum)
Acute, ongoing.  Unclear if related to allergies vs. Neck pain.  With history of allergies and small amount of fluid on left TM, will start on flonase daily and monitor symptoms.  Patient to call or return to clinic if symptoms persist >2 weeks.

## 2019-04-07 NOTE — Patient Instructions (Signed)
Nice meeting you today!  Let us know if your neck pain is not getting better.  Here are some exercises to try.  Take care and have fun in [place] this weekend! - Marquice Uddin   Neck Exercises Ask your health care provider which exercises are safe for you. Do exercises exactly as told by your health care provider and adjust them as directed. It is normal to feel mild stretching, pulling, tightness, or discomfort as you do these exercises. Stop right away if you feel sudden pain or your pain gets worse. Do not begin these exercises until told by your health care provider. Neck exercises can be important for many reasons. They can improve strength and maintain flexibility in your neck, which will help your upper back and prevent neck pain. Stretching exercises Rotation neck stretching  1. Sit in a chair or stand up. 2. Place your feet flat on the floor, shoulder width apart. 3. Slowly turn your head (rotate) to the right until a slight stretch is felt. Turn it all the way to the right so you can look over your right shoulder. Do not tilt or tip your head. 4. Hold this position for 10-30 seconds. 5. Slowly turn your head (rotate) to the left until a slight stretch is felt. Turn it all the way to the left so you can look over your left shoulder. Do not tilt or tip your head. 6. Hold this position for 10-30 seconds. Repeat __________ times. Complete this exercise __________ times a day. Neck retraction 1. Sit in a sturdy chair or stand up. 2. Look straight ahead. Do not bend your neck. 3. Use your fingers to push your chin backward (retraction). Do not bend your neck for this movement. Continue to face straight ahead. If you are doing the exercise properly, you will feel a slight sensation in your throat and a stretch at the back of your neck. 4. Hold the stretch for 1-2 seconds. Repeat __________ times. Complete this exercise __________ times a day. Strengthening exercises Neck press 1. Lie on your  back on a firm bed or on the floor with a pillow under your head. 2. Use your neck muscles to push your head down on the pillow and straighten your spine. 3. Hold the position as well as you can. Keep your head facing up (in a neutral position) and your chin tucked. 4. Slowly count to 5 while holding this position. Repeat __________ times. Complete this exercise __________ times a day. Isometrics These are exercises in which you strengthen the muscles in your neck while keeping your neck still (isometrics). 1. Sit in a supportive chair and place your hand on your forehead. 2. Keep your head and face facing straight ahead. Do not flex or extend your neck while doing isometrics. 3. Push forward with your head and neck while pushing back with your hand. Hold for 10 seconds. 4. Do the sequence again, this time putting your hand against the back of your head. Use your head and neck to push backward against the hand pressure. 5. Finally, do the same exercise on either side of your head, pushing sideways against the pressure of your hand. Repeat __________ times. Complete this exercise __________ times a day. Prone head lifts 1. Lie face-down (prone position), resting on your elbows so that your chest and upper back are raised. 2. Start with your head facing downward, near your chest. Position your chin either on or near your chest. 3. Slowly lift your head upward. Lift  until you are looking straight ahead. Then continue lifting your head as far back as you can comfortably stretch. 4. Hold your head up for 5 seconds. Then slowly lower it to your starting position. Repeat __________ times. Complete this exercise __________ times a day. Supine head lifts 1. Lie on your back (supine position), bending your knees to point to the ceiling and keeping your feet flat on the floor. 2. Lift your head slowly off the floor, raising your chin toward your chest. 3. Hold for 5 seconds. Repeat __________ times.  Complete this exercise __________ times a day. Scapular retraction 1. Stand with your arms at your sides. Look straight ahead. 2. Slowly pull both shoulders (scapulae) backward and downward (retraction) until you feel a stretch between your shoulder blades in your upper back. 3. Hold for 10-30 seconds. 4. Relax and repeat. Repeat __________ times. Complete this exercise __________ times a day. Contact a health care provider if:  Your neck pain or discomfort gets much worse when you do an exercise.  Your neck pain or discomfort does not improve within 2 hours after you exercise. If you have any of these problems, stop exercising right away. Do not do the exercises again unless your health care provider says that you can. Get help right away if:  You develop sudden, severe neck pain. If this happens, stop exercising right away. Do not do the exercises again unless your health care provider says that you can. This information is not intended to replace advice given to you by your health care provider. Make sure you discuss any questions you have with your health care provider. Document Revised: 11/03/2017 Document Reviewed: 11/03/2017 Elsevier Patient Education  [place].

## 2019-04-07 NOTE — Progress Notes (Signed)
BP (!) 144/79 (BP Location: Left Arm, Patient Position: Sitting, Cuff Size: Normal)   Pulse 64   Temp 98.5 F (36.9 C) (Oral)   Ht 5' (1.524 m)   Wt 123 lb (55.8 kg)   SpO2 98%   BMI 24.02 kg/m    Subjective:    Patient ID: Chelsea Hughes, female    DOB: [birth year], 69 y.o.   MRN: [id number]  HPI: Chelsea Hughes is a 69 y.o. female presenting for ear and neck pain.  Chief Complaint  Patient presents with  . Ear Pain    Ongoing appx 2 weeks. Ear pain that radiates to neck.   . Neck Pain    Painful for patient to turn head to left side.   NECK PAIN Onset: weeks Relief with NSAIDs?:  no Location:Right Duration:weeks Severity: moderate Quality: stabbing, throbbing  Frequency: with activity and turning head Radiation: headache Aggravating factors: movement Alleviating factors: nothing Status: worse Treatments attempted: rest, heat and ibuprofen  Weakness:  no Paresthesias / decreased sensation:  no  Fevers:  no  EAR PAIN Patient reports ear pain that has gotten worse over the past 3 days.  She rreports she gardens a lot outside and does have allergie prolems. She has been taking her allergy medication.  She has been taking OTC saline for nsaal spray Duration: days Involved ear(s): right Severity:  moderate , worse when turning head at all Quality:  stabbing and throbbing Fever: no Otorrhea: no Upper respiratory infection symptoms: no Pruritus: no Hearing loss: no Water immersion no Using Q-tips: yes Recurrent otitis media: no Status: worse Treatments attempted: allergy medication, ibuprofen, and saline nasal spray  No Known Allergies  Outpatient Encounter Medications as of 04/07/2019  Medication Sig  . aspirin EC 81 MG tablet Take 81 mg by mouth daily.  . Biotin 10000 MCG TABS Take by mouth.  . Calcium Carbonate (CALCIUM 600 PO) Take 600 mg by mouth 2 (two) times daily.  . Flaxseed, Linseed, (FLAXSEED OIL) 1000 MG CAPS Take 1,000 mg by mouth daily.  Marland Kitchen  loratadine (CLARITIN) 10 MG tablet Take 10 mg by mouth daily.  . Multiple Vitamin (MULTIVITAMIN) tablet Take 1 tablet by mouth daily.  . Omega-3 Fatty Acids (FISH OIL) 1200 MG CAPS Take 1,200 mg by mouth daily.  . TURMERIC PO Take by mouth.  . fluticasone (FLONASE) 50 MCG/ACT nasal spray Place 2 sprays into both nostrils daily.  . [EXPIRED] ketorolac (TORADOL) injection 30 mg    No facility-administered encounter medications on file as of 04/07/2019.   Patient Active Problem List   Diagnosis Date Noted  . Neck pain 04/07/2019  . Right ear pain 04/07/2019  . Hyperlipidemia 11/30/2016  . Advance directive discussed with patient 11/30/2016  . Microscopic hematuria   . Osteopenia   . Diverticulosis    Past Medical History:  Diagnosis Date  . Atypical squamous cell changes of undetermined significance (ASCUS) on cervical cytology with positive high risk human papilloma virus (HPV)   . Diverticulosis    on Colonoscopy 2007; CT scan 2014  . Microscopic hematuria    evaluated by Urology in 2014, abd/pelvis CT scan Dec 2014  . Osteoporosis    Relevant past medical, surgical, family and social history reviewed and updated as indicated. Interim medical history since our last visit reviewed.  Review of Systems  Constitutional: Negative.  Negative for activity change, appetite change, fatigue and fever.  HENT: Positive for ear pain. Negative for congestion, ear discharge, facial swelling, hearing  loss, sinus pressure, sneezing, sore throat and trouble swallowing.   Eyes: Negative.  Negative for pain, discharge, redness and itching.  Musculoskeletal: Positive for neck pain and neck stiffness. Negative for back pain, gait problem and joint swelling.  Skin: Negative.  Negative for color change.  Neurological: Negative.  Negative for dizziness, weakness, light-headedness, numbness and headaches.  Psychiatric/Behavioral: Negative.  Negative for agitation and confusion. The patient is not  nervous/anxious.     Per HPI unless specifically indicated above     Objective:    BP (!) 144/79 (BP Location: Left Arm, Patient Position: Sitting, Cuff Size: Normal)   Pulse 64   Temp 98.5 F (36.9 C) (Oral)   Ht 5' (1.524 m)   Wt 123 lb (55.8 kg)   SpO2 98%   BMI 24.02 kg/m   Wt Readings from Last 3 Encounters:  04/07/19 123 lb (55.8 kg)  12/19/18 124 lb 4 oz (56.4 kg)  12/05/18 124 lb (56.2 kg)    Physical Exam Vitals and nursing note reviewed.  Constitutional:      General: She is not in acute distress.    Appearance: Normal appearance. She is normal weight. She is not toxic-appearing.  HENT:     Head: Normocephalic and atraumatic.     Right Ear: Tympanic membrane, ear canal and external ear normal. There is no impacted cerumen.     Left Ear: Ear canal and external ear normal. A middle ear effusion is present. There is no impacted cerumen.     Nose: Nose normal. No congestion or rhinorrhea.     Mouth/Throat:     Mouth: Mucous membranes are moist.     Pharynx: Posterior oropharyngeal erythema present. No oropharyngeal exudate.  Eyes:     General: No scleral icterus.    Extraocular Movements: Extraocular movements intact.     Pupils: Pupils are equal, round, and reactive to light.  Musculoskeletal:        General: No swelling. Normal range of motion.     Cervical back: Normal range of motion and neck supple. Tenderness present.     Right lower leg: No edema.     Left lower leg: No edema.  Lymphadenopathy:     Cervical: No cervical adenopathy.  Skin:    General: Skin is warm and dry.     Coloration: Skin is not jaundiced or pale.  Neurological:     General: No focal deficit present.     Mental Status: She is alert and oriented to person, place, and time.     Motor: No weakness.     Gait: Gait normal.  Psychiatric:        Mood and Affect: Mood normal.        Behavior: Behavior normal.        Thought Content: Thought content normal.        Judgment: Judgment  normal.       Assessment & Plan:   Problem List Items Addressed This Visit      Other   Neck pain - Primary    Acute, ongoing.  No red flags in history/on exam, likely musculoskeletal.  Will manage with NSAIDs, exercises, heat, and rest.  IM toradol given in office today.  Patient declined muscle relaxer for now.  Follow up in 2 weeks if not better.       Right ear pain    Acute, ongoing.  Unclear if related to allergies vs. Neck pain.  With history of allergies and small amount  of fluid on left TM, will start on flonase daily and monitor symptoms.  Patient to call or return to clinic if symptoms persist >2 weeks.      Relevant Medications   fluticasone (FLONASE) 50 MCG/ACT nasal spray       Follow up plan: Return if symptoms worsen or fail to improve.

## 2019-04-07 NOTE — Assessment & Plan Note (Addendum)
Acute, ongoing.  No red flags in history/on exam, likely musculoskeletal.  Will manage with NSAIDs, exercises, heat, and rest.  IM toradol given in office today.  Patient declined muscle relaxer for now.  Follow up in 2 weeks if not better.

## 2019-04-12 ENCOUNTER — Telehealth: Payer: Self-pay | Admitting: Family Medicine

## 2019-04-12 NOTE — Telephone Encounter (Signed)
Pt. In office presenting form that needs to be signed and faxed from [employer] for [employer]. Per  C.Mayford Knife put in provider file to sign.

## 2019-04-12 NOTE — Telephone Encounter (Signed)
fyi

## 2019-04-20 HISTORY — PX: MANDIBLE SURGERY: SHX707

## 2019-05-05 ENCOUNTER — Emergency Department
Admission: EM | Admit: 2019-05-05 | Discharge: 2019-05-05 | Disposition: A | Discharge status: Nursing Home (Includes ICF) | Payer: Medicare PPO | Attending: Emergency Medicine | Admitting: Emergency Medicine

## 2019-05-05 ENCOUNTER — Ambulatory Visit: Payer: Self-pay | Admitting: Family Medicine

## 2019-05-05 ENCOUNTER — Other Ambulatory Visit: Payer: Self-pay

## 2019-05-05 ENCOUNTER — Emergency Department: Payer: Medicare PPO

## 2019-05-05 ENCOUNTER — Encounter: Payer: Self-pay | Admitting: Emergency Medicine

## 2019-05-05 DIAGNOSIS — S02621A Fracture of subcondylar process of right mandible, initial encounter for closed fracture: Secondary | ICD-10-CM

## 2019-05-05 DIAGNOSIS — S199XXA Unspecified injury of neck, initial encounter: Secondary | ICD-10-CM | POA: Diagnosis not present

## 2019-05-05 DIAGNOSIS — Z7982 Long term (current) use of aspirin: Secondary | ICD-10-CM | POA: Diagnosis not present

## 2019-05-05 DIAGNOSIS — S0993XA Unspecified injury of face, initial encounter: Secondary | ICD-10-CM | POA: Diagnosis not present

## 2019-05-05 DIAGNOSIS — S0182XA Laceration with foreign body of other part of head, initial encounter: Secondary | ICD-10-CM | POA: Diagnosis not present

## 2019-05-05 DIAGNOSIS — S025XXA Fracture of tooth (traumatic), initial encounter for closed fracture: Secondary | ICD-10-CM | POA: Diagnosis not present

## 2019-05-05 DIAGNOSIS — R6884 Jaw pain: Secondary | ICD-10-CM | POA: Diagnosis not present

## 2019-05-05 DIAGNOSIS — Y92017 Garden or yard in single-family (private) house as the place of occurrence of the external cause: Secondary | ICD-10-CM | POA: Insufficient documentation

## 2019-05-05 DIAGNOSIS — S01511A Laceration without foreign body of lip, initial encounter: Secondary | ICD-10-CM

## 2019-05-05 DIAGNOSIS — S02609A Fracture of mandible, unspecified, initial encounter for closed fracture: Secondary | ICD-10-CM | POA: Diagnosis not present

## 2019-05-05 DIAGNOSIS — W01198A Fall on same level from slipping, tripping and stumbling with subsequent striking against other object, initial encounter: Secondary | ICD-10-CM | POA: Insufficient documentation

## 2019-05-05 DIAGNOSIS — Y999 Unspecified external cause status: Secondary | ICD-10-CM | POA: Insufficient documentation

## 2019-05-05 DIAGNOSIS — S02611A Fracture of condylar process of right mandible, initial encounter for closed fracture: Secondary | ICD-10-CM | POA: Diagnosis not present

## 2019-05-05 DIAGNOSIS — Y93H2 Activity, gardening and landscaping: Secondary | ICD-10-CM | POA: Diagnosis not present

## 2019-05-05 DIAGNOSIS — Z01818 Encounter for other preprocedural examination: Secondary | ICD-10-CM | POA: Diagnosis not present

## 2019-05-05 DIAGNOSIS — M542 Cervicalgia: Secondary | ICD-10-CM | POA: Diagnosis not present

## 2019-05-05 DIAGNOSIS — S02641A Fracture of ramus of right mandible, initial encounter for closed fracture: Secondary | ICD-10-CM | POA: Diagnosis not present

## 2019-05-05 DIAGNOSIS — S0181XA Laceration without foreign body of other part of head, initial encounter: Secondary | ICD-10-CM

## 2019-05-05 DIAGNOSIS — E785 Hyperlipidemia, unspecified: Secondary | ICD-10-CM | POA: Diagnosis not present

## 2019-05-05 DIAGNOSIS — M47812 Spondylosis without myelopathy or radiculopathy, cervical region: Secondary | ICD-10-CM | POA: Diagnosis not present

## 2019-05-05 DIAGNOSIS — Z20822 Contact with and (suspected) exposure to covid-19: Secondary | ICD-10-CM | POA: Diagnosis not present

## 2019-05-05 MED ORDER — CEPHALEXIN 500 MG PO CAPS
500.0000 mg | ORAL_CAPSULE | Freq: Once | ORAL | Status: AC
  Administered 2019-05-05: 13:00:00 500 mg via ORAL
  Filled 2019-05-05: qty 1

## 2019-05-05 MED ORDER — LIDOCAINE-EPINEPHRINE-TETRACAINE (LET) TOPICAL GEL
3.0000 mL | Freq: Once | TOPICAL | Status: AC
  Administered 2019-05-05: 12:00:00 3 mL via TOPICAL
  Filled 2019-05-05: qty 3

## 2019-05-05 MED ORDER — LIDOCAINE-EPINEPHRINE (PF) 2 %-1:200000 IJ SOLN
10.0000 mL | Freq: Once | INTRAMUSCULAR | Status: AC
  Administered 2019-05-05: 10 mL
  Filled 2019-05-05: qty 10

## 2019-05-05 MED ORDER — CEPHALEXIN 500 MG PO CAPS: 500.0000 mg | ORAL_CAPSULE | Freq: Three times a day (TID) | ORAL | 0 refills | Status: DC

## 2019-05-05 MED ORDER — OXYCODONE-ACETAMINOPHEN 5-325 MG PO TABS: 1.0000 | ORAL_TABLET | Freq: Four times a day (QID) | ORAL | 0 refills | Status: DC | PRN

## 2019-05-05 NOTE — ED Triage Notes (Signed)
Pt also with a chipped tooth noted to upper jaw

## 2019-05-05 NOTE — ED Triage Notes (Signed)
Pt reports was moving mulch this am and tripped and fell hitting her chin. Pt with laceration noted under chin, bleeding controlled. Pt reports pain to jaw as well. Pt denies LOC

## 2019-05-05 NOTE — Discharge Instructions (Addendum)
Go to [facility] emergency department today.  Continue apply ice to your face as needed for discomfort and to help decrease swelling.  The sutures in your chin will need to be removed in 5 days and can be taken out at urgent care or wherever you are at that time.  Keflex was sent to the pharmacy to prevent infection and Percocet for pain as needed.  Do not eat or drink anything on your way to [facility] in the event that they may want to do something or a procedure tonight.

## 2019-05-05 NOTE — ED Notes (Signed)
See triage note  Presents s/p fall states she tripped over a tarp  Fell face first  Laceration noted to chin  Chipped front tooth  And also has pain to right jaw area

## 2019-05-05 NOTE — Telephone Encounter (Signed)
Pt reports fell few minutes ago, outside, concrete. States chin has been bleeding "a lot" and is presently.  States gapping laceration. Denies any other injury, no LOC. Directed to UC. States she would like to come to practice "To check it out." Reiterated need for UC, pt again questioning why she can't come straight to practice. NT called practice to confirm UC disposition, pt aware, states will follow disposition.   Reason for Disposition . Skin is split open or gaping (or length > 1/4 inch or 6 mm)  Answer Assessment - Initial Assessment Questions 1. MECHANISM: "How did the injury happen?"      Fell 2. ONSET: "When did the injury happen?" (Minutes or hours ago)     Minutes ago 3. LOCATION: "What part of the face is injured?"     chin 4. APPEARANCE of INJURY: "What does the face look like?"      5. BLEEDING: "Is it bleeding now?" If so, ask, "Is it difficult to stop?"     yes 6. PAIN: "Is there pain?" If so, ask: "How bad is the pain?"  (e.g., Scale 1-10; or mild, moderate, severe)     moderate 7. SIZE: For cuts, bruises, or swelling, ask: "How large is it?" (e.g., inches or centimeters)      "Can't tell, gapping" 8. TETANUS: For any breaks in the skin, ask: "When was the last tetanus booster?"     unsure 9. OTHER SYMPTOMS: "Do you have any other symptoms?" (e.g., neck pain, headache, loss of consciousness)     no  Protocols used: FACE INJURY-A-AH

## 2019-05-05 NOTE — ED Provider Notes (Signed)
[facility] Emergency Department Provider Note   ____________________________________________   First MD Initiated Contact with Patient 05/05/19 1004     (approximate)  I have reviewed the triage vital signs and the nursing notes.   HISTORY  Chief Complaint Fall and Laceration   HPI Chelsea GANGE is a 69 y.o. female presents to the ED with complaint of laceration to her chin, chipped front tooth, and right-sided facial pain going into her mandible.  Patient fell when she tripped over a tarp this morning spreading mulch.  Patient denies any head injury or loss of consciousness.  Bleeding currently is controlled.  Patient continues to ambulate without any difficulty and denies any pain to her extremities.  She rates her pain as a 5/10.  Patient also states that prior to arrival she did remove a piece of a tooth from her lip.      Past Medical History:  Diagnosis Date  . Atypical squamous cell changes of undetermined significance (ASCUS) on cervical cytology with positive high risk human papilloma virus (HPV)   . Diverticulosis    on Colonoscopy 2007; CT scan 2014  . Microscopic hematuria    evaluated by Urology in 2014, abd/pelvis CT scan Dec 2014  . Osteoporosis     Patient Active Problem List   Diagnosis Date Noted  . Neck pain 04/07/2019  . Right ear pain 04/07/2019  . Hyperlipidemia 11/30/2016  . Advance directive discussed with patient 11/30/2016  . Microscopic hematuria   . Osteopenia   . Diverticulosis     History reviewed. No pertinent surgical history.  Prior to Admission medications   Medication Sig Start Date End Date Taking? Authorizing Provider  aspirin EC 81 MG tablet Take 81 mg by mouth daily.    [provider]  Biotin 44010 MCG TABS Take by mouth.    [provider]  Calcium Carbonate (CALCIUM 600 PO) Take 600 mg by mouth 2 (two) times daily.    [provider]  cephALEXin (KEFLEX) 500 MG capsule  Take 1 capsule (500 mg total) by mouth 3 (three) times daily. 05/05/19   Tommi Rumps, PA-C  Flaxseed, Linseed, (FLAXSEED OIL) 1000 MG CAPS Take 1,000 mg by mouth daily.    [provider]  fluticasone (FLONASE) 50 MCG/ACT nasal spray Place 2 sprays into both nostrils daily. 04/07/19   Mardene Celeste I, NP  loratadine (CLARITIN) 10 MG tablet Take 10 mg by mouth daily.    [provider]  Multiple Vitamin (MULTIVITAMIN) tablet Take 1 tablet by mouth daily.    [provider]  Omega-3 Fatty Acids (FISH OIL) 1200 MG CAPS Take 1,200 mg by mouth daily.    [provider]  oxyCODONE-acetaminophen (PERCOCET) 5-325 MG tablet Take 1 tablet by mouth every 6 (six) hours as needed for severe pain. 05/05/19   Tommi Rumps, PA-C  TURMERIC PO Take by mouth.    [provider]    Allergies Patient has no known allergies.  Family History  Problem Relation Age of Onset  . Heart disease Mother        CABG  . Hypertension Mother   . Osteoporosis Mother   . Thyroid disease Mother   . Heart disease Father   . Hypertension Father   . Cancer Father        throat (smoker)  . COPD Neg Hx   . Diabetes Neg Hx   . Stroke Neg Hx   . Breast cancer Neg Hx  Social History Social History   Tobacco Use  . Smoking status: Never Smoker  . Smokeless tobacco: Never Used  Substance Use Topics  . Alcohol use: Yes    Alcohol/week: 7.0 standard drinks    Types: 7 Glasses of wine per week    Comment: wine  . Drug use: No    Review of Systems Constitutional: No fever/chills Eyes: No visual changes. ENT: Negative for nasal trauma, positive for dental injury, positive for right-sided facial pain including the mandible. Cardiovascular: Denies chest pain. Respiratory: Denies shortness of breath. Gastrointestinal: No abdominal pain.  No nausea, no vomiting.   Musculoskeletal: Negative for back pain or injury to extremities Skin: Positive for  laceration. Neurological: Negative for headaches, focal weakness or numbness.  ____________________________________________   PHYSICAL EXAM:  VITAL SIGNS: ED Triage Vitals  Enc Vitals Group     BP 05/05/19 0934 (!) 185/70     Pulse Rate 05/05/19 0934 72     Resp 05/05/19 0934 20     Temp 05/05/19 0934 98.5 F (36.9 C)     Temp Source 05/05/19 0934 Oral     SpO2 05/05/19 0934 98 %     Weight 05/05/19 0931 123 lb (55.8 kg)     Height 05/05/19 0931 5' (1.524 m)     Head Circumference --      Peak Flow --      Pain Score 05/05/19 0931 5     Pain Loc --      Pain Edu? --      Excl. in GC? --     Constitutional: Alert and oriented. Well appearing and in no acute distress. Eyes: Conjunctivae are normal. PERRL. EOMI. Head: Atraumatic. Nose: No tenderness and no bleeding noted. Mouth/Throat: Mucous membranes are moist.  Oropharynx non-erythematous.  Unable to open completely due to pain right lateral mandibular area.  There is a chip on the left central incisor.  No gum injury noted.  There is a puncture wound most likely from her tooth on the inner lower lip without active bleeding.  There is also a 0. 5 cm outer lower lip without active bleeding and no obvious foreign body. Neck: No stridor.  No point tenderness on palpation of cervical spine posteriorly. Cardiovascular: Normal rate, regular rhythm. Grossly normal heart sounds.  Good peripheral circulation. Respiratory: Normal respiratory effort.  No retractions. Lungs CTAB. Gastrointestinal: Soft and nontender.  Musculoskeletal: No point tenderness on palpation of the thoracic or lumbar spine.  Patient is able move upper and lower extremities without any difficulty and denies any pain.  Patient is ambulatory without any assistance. Neurologic:  Normal speech and language. No gross focal neurologic deficits are appreciated. No gait instability. Skin:  Skin is warm, dry.  Laceration to the chin measured approximately 2.5 cm with  minimal bleeding.  No obvious foreign bodies noted. Psychiatric: Mood and affect are normal. Speech and behavior are normal.  ____________________________________________   LABS (all labs ordered are listed, but only abnormal results are displayed)  Labs Reviewed - No data to display RADIOLOGY   Official radiology report(s): CT Cervical Spine Wo Contrast  Result Date: 05/05/2019 CLINICAL DATA:  Neck pain, acute, no red flags, fall injury. EXAM: CT CERVICAL SPINE WITHOUT CONTRAST TECHNIQUE: Multidetector CT imaging of the cervical spine was performed without intravenous contrast. Multiplanar CT image reconstructions were also generated. COMPARISON:  No pertinent prior studies available for comparison. FINDINGS: Alignment: Trace C7-T1 anterolisthesis. Skull base and vertebrae: The basion-dental and atlanto-dental intervals are maintained.No  evidence of acute fracture to the cervical spine. Soft tissues and spinal canal: No prevertebral fluid or swelling. No visible canal hematoma. Disc levels: Mild for age cervical spondylosis without high-grade bony spinal canal narrowing. Upper chest: No consolidation within the imaged lung apices. No visible pneumothorax. Other: Acute fracture of the right mandible. Please correlate with concurrent maxillofacial CT for further description. IMPRESSION: No evidence of acute fracture to the cervical spine. Mild for age cervical spondylosis. Electronically Signed   By: Jackey Loge DO   On: 05/05/2019 11:29   CT Maxillofacial Wo Contrast  Result Date: 05/05/2019 CLINICAL DATA:  Right-sided facial pain and painful mandible laceration under chin; facial trauma. EXAM: CT MAXILLOFACIAL WITHOUT CONTRAST TECHNIQUE: Multidetector CT imaging of the maxillofacial structures was performed. Multiplanar CT image reconstructions were also generated. COMPARISON:  No pertinent prior studies available for comparison. FINDINGS: Osseous: There is an acute, displaced subcondylar  fracture of the right mandible which appears to traverse the sigmoid notch. There is medial displacement of the right mandibular ramus relative to the condyle. No other acute maxillofacial fracture is identified. Orbits: No acute abnormality. Sinuses: Trace mucosal thickening within the left maxillary sinus. No significant mastoid effusion. Soft tissues: There is a laceration within the chin soft tissues. There is a subcentimeter focus of hyperdense within the lower lip soft tissues which may reflect a foreign body or tooth fragment (series 2, image 60). Limited intracranial: Generalized parenchymal atrophy of the brain. No acute abnormality is identified. IMPRESSION: Acute, displaced subcondylar right mandible fracture traversing the sigmoid notch. Laceration of the chin soft tissues. Subcentimeter focus of hyperdensity within the lower lip soft tissues, which may reflect a foreign body or tooth fragment. Electronically Signed   By: Jackey Loge DO   On: 05/05/2019 11:40    ____________________________________________   PROCEDURES  Procedure(s) performed (including Critical Care):  Marland KitchenMarland KitchenLaceration Repair  Date/Time: 05/05/2019 12:35 PM Performed by: Tommi Rumps, PA-C Authorized by: Tommi Rumps, PA-C   Consent:    Consent obtained:  Verbal   Consent given by:  Patient   Risks discussed:  Infection, poor cosmetic result and poor wound healing Anesthesia (see MAR for exact dosages):    Anesthesia method:  Topical application and local infiltration   Topical anesthetic:  LET   Local anesthetic:  Lidocaine 1% WITH epi Laceration details:    Location:  Face   Face location:  Chin   Length (cm):  2.5 Repair type:    Repair type:  Simple Pre-procedure details:    Preparation:  Patient was prepped and draped in usual sterile fashion and imaging obtained to evaluate for foreign bodies Exploration:    Hemostasis achieved with:  LET   Wound exploration: entire depth of wound probed and  visualized     Contaminated: yes   Treatment:    Area cleansed with:  Saline   Amount of cleaning:  Extensive   Irrigation solution:  Sterile saline   Irrigation volume:  60 cc   Irrigation method:  Pressure wash   Visualized foreign bodies/material removed: yes   Skin repair:    Repair method:  Sutures   Suture size:  6-0   Suture material:  Prolene   Suture technique:  Simple interrupted   Number of sutures:  5 Approximation:    Approximation:  Close Post-procedure details:    Patient tolerance of procedure:  Tolerated well, no immediate complications .Foreign Body Removal  Date/Time: 05/05/2019 2:52 PM Performed by: Tommi Rumps, PA-C Authorized  by: Johnn Hai, PA-C  Consent: Verbal consent obtained. Consent given by: patient Patient understanding: patient states understanding of the procedure being performed Imaging studies: imaging studies available Patient identity confirmed: verbally with patient Body area: skin General location: head/neck Anesthesia: local infiltration  Anesthesia: Local Anesthetic: lidocaine 1% with epinephrine Anesthetic total: 1 mL  Sedation: Patient sedated: no  Patient cooperative: yes Removal mechanism: forceps Depth: subcutaneous Complexity: simple 1 objects recovered. Objects recovered: tooth chip Post-procedure assessment: foreign body removed Patient tolerance: patient tolerated the procedure well with no immediate complications     ____________________________________________   INITIAL IMPRESSION / ASSESSMENT AND PLAN / ED COURSE  As part of my medical decision making, I reviewed the following data within the electronic MEDICAL RECORD NUMBER Notes from prior ED visits and [place] Controlled Substance Database  69 year old female presents to the ED with injury to the right side of her face, dental injury and laceration to her chin.  Patient was spreading mulch in her yard when she tripped over the tarp causing her to fall  forward landing on her chin.  Patient denied any head injury or loss of consciousness.  She states that her tetanus is up-to-date.  CT head was negative.  CT maxillofacial shows a displaced right mandible fracture.  Laceration to the chin was repaired without any difficulty.  The CT of her face suggest that there is a small particle of her tooth in the lip.  This was injected with 1 cc of lidocaine with epinephrine.  A very small chip was removed with a pair of forceps without any difficulty.  Patient was made aware that a prescription for Keflex and Percocet was being sent to her pharmacy.  [facility] was called for further evaluation and treatment of her mandible fracture.  Patient states she prefers to go by POV.  Plan was discussed with Dr. Quillian Quince who is on-call for oral maxillofacial surgery and also with Dr. Junious Silk who is the ED doctor at [facility].  Because the oral maxillofacial office will be closed at 4 and patient is not able to get there prior to closure she will be evaluated in the ED and a plan will be made after evaluating the patient and also seeing her images.  Patient was made aware of this plan.  Patient was offered pain medication prior to discharge however she prefers not to take anything and states that the ice pack to her face right now is all she needs. Dr. Corky Downs was made aware and came to see the patient.  EMTALA form was completed by Dr. Corky Downs.  ____________________________________________   FINAL CLINICAL IMPRESSION(S) / ED DIAGNOSES  Final diagnoses:  Closed subcondylar fracture of right side of mandible, initial encounter ([place])  Chin laceration, initial encounter  Dental injury, initial encounter  Laceration of lower lip, initial encounter     ED Discharge Orders         Ordered    cephALEXin (KEFLEX) 500 MG capsule  3 times daily     05/05/19 1357    oxyCODONE-acetaminophen (PERCOCET) 5-325 MG tablet  Every 6 hours PRN     05/05/19 1357           Note:  This  document was prepared using Dragon voice recognition software and may include unintentional dictation errors.    Johnn Hai, PA-C 05/05/19 1514    Lavonia Drafts, MD 05/05/19 [phone]

## 2019-05-05 NOTE — ED Notes (Signed)
Sitting in chair  Friend in room with pt

## 2019-05-08 DIAGNOSIS — S02611A Fracture of condylar process of right mandible, initial encounter for closed fracture: Secondary | ICD-10-CM | POA: Diagnosis not present

## 2019-05-08 DIAGNOSIS — S02641A Fracture of ramus of right mandible, initial encounter for closed fracture: Secondary | ICD-10-CM | POA: Diagnosis not present

## 2019-05-08 DIAGNOSIS — S0181XA Laceration without foreign body of other part of head, initial encounter: Secondary | ICD-10-CM | POA: Diagnosis not present

## 2019-06-09 DIAGNOSIS — S02609D Fracture of mandible, unspecified, subsequent encounter for fracture with routine healing: Secondary | ICD-10-CM | POA: Insufficient documentation

## 2019-06-27 DIAGNOSIS — Z472 Encounter for removal of internal fixation device: Secondary | ICD-10-CM | POA: Diagnosis not present

## 2019-06-27 DIAGNOSIS — S02611D Fracture of condylar process of right mandible, subsequent encounter for fracture with routine healing: Secondary | ICD-10-CM | POA: Diagnosis not present

## 2019-06-27 DIAGNOSIS — S02601D Fracture of unspecified part of body of right mandible, subsequent encounter for fracture with routine healing: Secondary | ICD-10-CM | POA: Diagnosis not present

## 2019-06-27 DIAGNOSIS — S02602D Fracture of unspecified part of body of left mandible, subsequent encounter for fracture with routine healing: Secondary | ICD-10-CM | POA: Diagnosis not present

## 2019-07-10 ENCOUNTER — Other Ambulatory Visit: Payer: Self-pay

## 2019-07-10 ENCOUNTER — Encounter: Payer: Self-pay | Admitting: Family Medicine

## 2019-07-10 ENCOUNTER — Ambulatory Visit (INDEPENDENT_AMBULATORY_CARE_PROVIDER_SITE_OTHER): Payer: Medicare PPO | Admitting: Family Medicine

## 2019-07-10 VITALS — BP 152/68 | HR 71 | Temp 98.6°F | Ht 59.0 in | Wt 114.4 lb

## 2019-07-10 DIAGNOSIS — E782 Mixed hyperlipidemia: Secondary | ICD-10-CM

## 2019-07-10 DIAGNOSIS — I10 Essential (primary) hypertension: Secondary | ICD-10-CM

## 2019-07-10 MED ORDER — LISINOPRIL 5 MG PO TABS: 5.0000 mg | ORAL_TABLET | Freq: Every day | ORAL | 3 refills | Status: DC

## 2019-07-10 NOTE — Assessment & Plan Note (Signed)
Will start her on low dose lisinopril and recheck next month. Call with any concerns. Continue to monitor.

## 2019-07-10 NOTE — Progress Notes (Signed)
BP (!) 152/68 (BP Location: Left Arm, Cuff Size: Normal)   Pulse 71   Temp 98.6 F (37 C) (Oral)   Ht 4\' 11"  (1.499 m)   Wt 114 lb 6.4 oz (51.9 kg)   SpO2 100%   BMI 23.11 kg/m    Subjective:    Patient ID: Chelsea Hughes, female    DOB: Aug 05, 1950, 69 y.o.   MRN: [id number]  HPI: Chelsea Hughes is a 69 y.o. female  Chief Complaint  Patient presents with  . Hyperlipidemia   64 and broke her jaw. Teeth don't feel like they mesh up right.   HYPERTENSION / HYPERLIPIDEMIA Satisfied with current treatment? yes Duration of hypertension: months BP monitoring frequency: not checking BP medication side effects: not on anything Past BP meds: none Duration of hyperlipidemia: chronic Cholesterol medication side effects: not on anything Cholesterol supplements: fish oil Past cholesterol medications: none Aspirin: yes Recent stressors: yes Recurrent headaches: no Visual changes: no Palpitations: no Dyspnea: no Chest pain: no Lower extremity edema: no Dizzy/lightheaded: no  Relevant past medical, surgical, family and social history reviewed and updated as indicated. Interim medical history since our last visit reviewed. Allergies and medications reviewed and updated.  Review of Systems  Constitutional: Negative.   HENT: Positive for dental problem. Negative for congestion, drooling, ear discharge, ear pain, facial swelling, hearing loss, mouth sores, nosebleeds, postnasal drip, rhinorrhea, sinus pressure, sinus pain, sneezing, sore throat, tinnitus, trouble swallowing and voice change.   Respiratory: Negative.   Cardiovascular: Negative.   Musculoskeletal: Negative.   Skin: Negative.   Psychiatric/Behavioral: Negative.     Per HPI unless specifically indicated above     Objective:    BP (!) 152/68 (BP Location: Left Arm, Cuff Size: Normal)   Pulse 71   Temp 98.6 F (37 C) (Oral)   Ht 4\' 11"  (1.499 m)   Wt 114 lb 6.4 oz (51.9 kg)   SpO2 100%   BMI 23.11 kg/m    Wt Readings from Last 3 Encounters:  07/10/19 114 lb 6.4 oz (51.9 kg)  05/05/19 123 lb (55.8 kg)  04/07/19 123 lb (55.8 kg)    Physical Exam Vitals and nursing note reviewed.  Constitutional:      General: She is not in acute distress.    Appearance: Normal appearance. She is not ill-appearing, toxic-appearing or diaphoretic.  HENT:     Head: Normocephalic and atraumatic.     Right Ear: External ear normal.     Left Ear: External ear normal.     Nose: Nose normal.     Mouth/Throat:     Mouth: Mucous membranes are moist.     Pharynx: Oropharynx is clear.  Eyes:     General: No scleral icterus.       Right eye: No discharge.        Left eye: No discharge.     Extraocular Movements: Extraocular movements intact.     Conjunctiva/sclera: Conjunctivae normal.     Pupils: Pupils are equal, round, and reactive to light.  Cardiovascular:     Rate and Rhythm: Normal rate and regular rhythm.     Pulses: Normal pulses.     Heart sounds: Normal heart sounds. No murmur heard.  No friction rub. No gallop.   Pulmonary:     Effort: Pulmonary effort is normal. No respiratory distress.     Breath sounds: Normal breath sounds. No stridor. No wheezing, rhonchi or rales.  Chest:     Chest wall: No  tenderness.  Musculoskeletal:        General: Normal range of motion.     Cervical back: Normal range of motion and neck supple.  Skin:    General: Skin is warm and dry.     Capillary Refill: Capillary refill takes less than 2 seconds.     Coloration: Skin is not jaundiced or pale.     Findings: No bruising, erythema, lesion or rash.  Neurological:     General: No focal deficit present.     Mental Status: She is alert and oriented to person, place, and time. Mental status is at baseline.  Psychiatric:        Mood and Affect: Mood normal.        Behavior: Behavior normal.        Thought Content: Thought content normal.        Judgment: Judgment normal.     Results for orders placed or  performed in visit on 12/19/18  Microscopic Examination  Result Value Ref Range   WBC, UA None seen 0 - 5 /hpf   RBC 3-10 (A) 0 - 2 /hpf   Epithelial Cells (non renal) 0-10 0 - 10 /hpf   Bacteria, UA None seen None seen/Few  Lipid Panel w/o Chol/HDL Ratio OUT  Result Value Ref Range   Cholesterol, Total 212 (H) 100 - 199 mg/dL   Triglycerides 212 (H) 0 - 149 mg/dL   HDL 59 >39 mg/dL   VLDL Cholesterol Cal 37 5 - 40 mg/dL   LDL Chol Calc (NIH) 116 (H) 0 - 99 mg/dL  Microalbumin, Urine Waived  Result Value Ref Range   Microalb, Ur Waived 10 0 - 19 mg/L   Creatinine, Urine Waived 50 10 - 300 mg/dL   Microalb/Creat Ratio 30-300 (H) <30 mg/g  UA/M w/rflx Culture, Routine   Specimen: Urine   URINE  Result Value Ref Range   Specific Gravity, UA 1.010 1.005 - 1.030   pH, UA 6.0 5.0 - 7.5   Color, UA Yellow Yellow   Appearance Ur Clear Clear   Leukocytes,UA Negative Negative   Protein,UA Negative Negative/Trace   Glucose, UA Negative Negative   Ketones, UA Negative Negative   RBC, UA 1+ (A) Negative   Bilirubin, UA Negative Negative   Urobilinogen, Ur 0.2 0.2 - 1.0 mg/dL   Nitrite, UA Negative Negative   Microscopic Examination See below:       Assessment & Plan:   Problem List Items Addressed This Visit      Cardiovascular and Mediastinum   HTN (hypertension)    Will start her on low dose lisinopril and recheck next month. Call with any concerns. Continue to monitor.       Relevant Medications   lisinopril (ZESTRIL) 5 MG tablet     Other   Hyperlipidemia - Primary    Rechecking levels today. Await results. Treat as needed.       Relevant Medications   lisinopril (ZESTRIL) 5 MG tablet   Other Relevant Orders   Comprehensive metabolic panel   Lipid Panel w/o Chol/HDL Ratio       Follow up plan: Return in about 4 weeks (around 08/07/2019).

## 2019-07-10 NOTE — Assessment & Plan Note (Signed)
Rechecking levels today. Await results. Treat as needed.  

## 2019-07-11 ENCOUNTER — Encounter: Payer: Self-pay | Admitting: Family Medicine

## 2019-07-11 LAB — LIPID PANEL W/O CHOL/HDL RATIO
Cholesterol, Total: 190 mg/dL (ref 100–199)
HDL: 49 mg/dL (ref >39)
LDL Chol Calc (NIH): 119 mg/dL — ABNORMAL HIGH (ref 0–99)
Triglycerides: 121 mg/dL (ref 0–149)
VLDL Cholesterol Cal: 22 mg/dL (ref 5–40)

## 2019-07-11 LAB — COMPREHENSIVE METABOLIC PANEL
ALT: 10 IU/L (ref 0–32)
AST: 17 IU/L (ref 0–40)
Albumin/Globulin Ratio: 1.9 (ref 1.2–2.2)
Albumin: 4.5 g/dL (ref 3.8–4.8)
Alkaline Phosphatase: 47 IU/L — ABNORMAL LOW (ref 48–121)
BUN/Creatinine Ratio: 16 (ref 12–28)
BUN: 13 mg/dL (ref 8–27)
Bilirubin Total: 0.4 mg/dL (ref 0.0–1.2)
CO2: 25 mmol/L (ref 20–29)
Calcium: 9.7 mg/dL (ref 8.7–10.3)
Chloride: 101 mmol/L (ref 96–106)
Creatinine, Ser: 0.82 mg/dL (ref 0.57–1.00)
GFR calc Af Amer: 84 mL/min/{1.73_m2} (ref >59)
GFR calc non Af Amer: 73 mL/min/{1.73_m2} (ref >59)
Globulin, Total: 2.4 g/dL (ref 1.5–4.5)
Glucose: 92 mg/dL (ref 65–99)
Potassium: 4.5 mmol/L (ref 3.5–5.2)
Sodium: 139 mmol/L (ref 134–144)
Total Protein: 6.9 g/dL (ref 6.0–8.5)

## 2019-08-07 ENCOUNTER — Encounter: Payer: Self-pay | Admitting: Family Medicine

## 2019-08-07 ENCOUNTER — Other Ambulatory Visit: Payer: Self-pay

## 2019-08-07 ENCOUNTER — Ambulatory Visit (INDEPENDENT_AMBULATORY_CARE_PROVIDER_SITE_OTHER): Payer: Medicare PPO | Admitting: Family Medicine

## 2019-08-07 VITALS — BP 128/62 | HR 75 | Temp 98.4°F | Wt 114.0 lb

## 2019-08-07 DIAGNOSIS — I10 Essential (primary) hypertension: Secondary | ICD-10-CM | POA: Diagnosis not present

## 2019-08-07 MED ORDER — LISINOPRIL 5 MG PO TABS: 5.0000 mg | ORAL_TABLET | Freq: Every day | ORAL | 1 refills | Status: DC

## 2019-08-07 NOTE — Progress Notes (Signed)
BP 128/62 (BP Location: Left Arm, Cuff Size: Normal)   Pulse 75   Temp 98.4 F (36.9 C) (Oral)   Wt 114 lb (51.7 kg)   SpO2 99%   BMI 23.03 kg/m    Subjective:    Patient ID: Chelsea Hughes, female    DOB: 1950-02-25, 69 y.o.   MRN: [id number]  HPI: Chelsea Hughes is a 69 y.o. female  Chief Complaint  Patient presents with  . Hypertension   HYPERTENSION Hypertension status: better  Satisfied with current treatment? yes Duration of hypertension: chronic BP monitoring frequency:  not checking BP medication side effects:  no Medication compliance: excellent compliance Previous BP meds:lisinopril Aspirin: yes Recurrent headaches: no Visual changes: no Palpitations: no Dyspnea: no Chest pain: no Lower extremity edema: no Dizzy/lightheaded: no  Relevant past medical, surgical, family and social history reviewed and updated as indicated. Interim medical history since our last visit reviewed. Allergies and medications reviewed and updated.  Review of Systems  Constitutional: Negative.   Respiratory: Negative.   Cardiovascular: Negative.   Gastrointestinal: Negative.   Psychiatric/Behavioral: Negative.     Per HPI unless specifically indicated above     Objective:    BP 128/62 (BP Location: Left Arm, Cuff Size: Normal)   Pulse 75   Temp 98.4 F (36.9 C) (Oral)   Wt 114 lb (51.7 kg)   SpO2 99%   BMI 23.03 kg/m   Wt Readings from Last 3 Encounters:  08/07/19 114 lb (51.7 kg)  07/10/19 114 lb 6.4 oz (51.9 kg)  05/05/19 123 lb (55.8 kg)    Physical Exam Vitals and nursing note reviewed.  Constitutional:      General: She is not in acute distress.    Appearance: Normal appearance. She is not ill-appearing, toxic-appearing or diaphoretic.  HENT:     Head: Normocephalic and atraumatic.     Right Ear: External ear normal.     Left Ear: External ear normal.     Nose: Nose normal.     Mouth/Throat:     Mouth: Mucous membranes are moist.     Pharynx:  Oropharynx is clear.  Eyes:     General: No scleral icterus.       Right eye: No discharge.        Left eye: No discharge.     Extraocular Movements: Extraocular movements intact.     Conjunctiva/sclera: Conjunctivae normal.     Pupils: Pupils are equal, round, and reactive to light.  Cardiovascular:     Rate and Rhythm: Normal rate and regular rhythm.     Pulses: Normal pulses.     Heart sounds: Normal heart sounds. No murmur heard.  No friction rub. No gallop.   Pulmonary:     Effort: Pulmonary effort is normal. No respiratory distress.     Breath sounds: Normal breath sounds. No stridor. No wheezing, rhonchi or rales.  Chest:     Chest wall: No tenderness.  Musculoskeletal:        General: Normal range of motion.     Cervical back: Normal range of motion and neck supple.  Skin:    General: Skin is warm and dry.     Capillary Refill: Capillary refill takes less than 2 seconds.     Coloration: Skin is not jaundiced or pale.     Findings: No bruising, erythema, lesion or rash.  Neurological:     General: No focal deficit present.     Mental Status: She is alert  and oriented to person, place, and time. Mental status is at baseline.  Psychiatric:        Mood and Affect: Mood normal.        Behavior: Behavior normal.        Thought Content: Thought content normal.        Judgment: Judgment normal.     Results for orders placed or performed in visit on 07/10/19  Comprehensive metabolic panel  Result Value Ref Range   Glucose 92 65 - 99 mg/dL   BUN 13 8 - 27 mg/dL   Creatinine, Ser 9.67 0.57 - 1.00 mg/dL   GFR calc non Af Amer 73 >59 mL/min/1.73   GFR calc Af Amer 84 >59 mL/min/1.73   BUN/Creatinine Ratio 16 12 - 28   Sodium 139 134 - 144 mmol/L   Potassium 4.5 3.5 - 5.2 mmol/L   Chloride 101 96 - 106 mmol/L   CO2 25 20 - 29 mmol/L   Calcium 9.7 8.7 - 10.3 mg/dL   Total Protein 6.9 6.0 - 8.5 g/dL   Albumin 4.5 3.8 - 4.8 g/dL   Globulin, Total 2.4 1.5 - 4.5 g/dL    Albumin/Globulin Ratio 1.9 1.2 - 2.2   Bilirubin Total 0.4 0.0 - 1.2 mg/dL   Alkaline Phosphatase 47 (L) 48 - 121 IU/L   AST 17 0 - 40 IU/L   ALT 10 0 - 32 IU/L  Lipid Panel w/o Chol/HDL Ratio  Result Value Ref Range   Cholesterol, Total 190 100 - 199 mg/dL   Triglycerides 591 0 - 149 mg/dL   HDL 49 >63 mg/dL   VLDL Cholesterol Cal 22 5 - 40 mg/dL   LDL Chol Calc (NIH) 846 (H) 0 - 99 mg/dL      Assessment & Plan:   Problem List Items Addressed This Visit      Cardiovascular and Mediastinum   HTN (hypertension) - Primary    Doing well on her lisinopril. Will continue current regimen. Checking BMP today. Call with any concerns. Refills given.       Relevant Medications   lisinopril (ZESTRIL) 5 MG tablet   Other Relevant Orders   Basic metabolic panel       Follow up plan: Return in about 6 months (around 02/07/2020) for wellness/physical.

## 2019-08-07 NOTE — Assessment & Plan Note (Signed)
Doing well on her lisinopril. Will continue current regimen. Checking BMP today. Call with any concerns. Refills given.

## 2019-08-08 LAB — BASIC METABOLIC PANEL
BUN/Creatinine Ratio: 21 (ref 12–28)
BUN: 17 mg/dL (ref 8–27)
CO2: 26 mmol/L (ref 20–29)
Calcium: 9.9 mg/dL (ref 8.7–10.3)
Chloride: 101 mmol/L (ref 96–106)
Creatinine, Ser: 0.8 mg/dL (ref 0.57–1.00)
GFR calc Af Amer: 86 mL/min/{1.73_m2} (ref >59)
GFR calc non Af Amer: 75 mL/min/{1.73_m2} (ref >59)
Glucose: 81 mg/dL (ref 65–99)
Potassium: 5.5 mmol/L — ABNORMAL HIGH (ref 3.5–5.2)
Sodium: 141 mmol/L (ref 134–144)

## 2019-08-09 ENCOUNTER — Encounter: Payer: Self-pay | Admitting: Family Medicine

## 2019-10-04 DIAGNOSIS — M26602 Left temporomandibular joint disorder, unspecified: Secondary | ICD-10-CM | POA: Diagnosis not present

## 2019-10-04 DIAGNOSIS — S02611D Fracture of condylar process of right mandible, subsequent encounter for fracture with routine healing: Secondary | ICD-10-CM | POA: Diagnosis not present

## 2019-10-19 DIAGNOSIS — S0301XA Dislocation of jaw, right side, initial encounter: Secondary | ICD-10-CM | POA: Diagnosis not present

## 2019-10-19 DIAGNOSIS — M264 Malocclusion, unspecified: Secondary | ICD-10-CM | POA: Diagnosis not present

## 2019-10-19 DIAGNOSIS — S02611D Fracture of condylar process of right mandible, subsequent encounter for fracture with routine healing: Secondary | ICD-10-CM | POA: Diagnosis not present

## 2019-10-19 DIAGNOSIS — S0269XD Fracture of mandible of other specified site, subsequent encounter for fracture with routine healing: Secondary | ICD-10-CM | POA: Diagnosis not present

## 2019-11-07 DIAGNOSIS — S02609G Fracture of mandible, unspecified, subsequent encounter for fracture with delayed healing: Secondary | ICD-10-CM | POA: Diagnosis not present

## 2019-11-07 DIAGNOSIS — S02609D Fracture of mandible, unspecified, subsequent encounter for fracture with routine healing: Secondary | ICD-10-CM | POA: Diagnosis not present

## 2019-11-30 DIAGNOSIS — M26601 Right temporomandibular joint disorder, unspecified: Secondary | ICD-10-CM | POA: Diagnosis not present

## 2019-12-18 ENCOUNTER — Telehealth: Payer: Self-pay | Admitting: Family Medicine

## 2019-12-18 NOTE — Telephone Encounter (Signed)
Copied from CRM [id number]. Topic: Medicare AWV >> Dec 18, 2019  1:34 PM Geraldine Contras wrote: Reason for CRM:  Left message to inform Medicare Wellness visit on Dec 6,2021 at 8:15 will be by phone not in office.

## 2019-12-25 ENCOUNTER — Ambulatory Visit (INDEPENDENT_AMBULATORY_CARE_PROVIDER_SITE_OTHER): Payer: Medicare PPO

## 2019-12-25 VITALS — Ht 60.0 in | Wt 115.0 lb

## 2019-12-25 DIAGNOSIS — Z0000 Encounter for general adult medical examination without abnormal findings: Secondary | ICD-10-CM | POA: Diagnosis not present

## 2019-12-25 NOTE — Progress Notes (Signed)
I connected with Chelsea Hughes today by telephone and verified that I am speaking with the correct person using two identifiers. Location patient: home Location provider: work Persons participating in the virtual visit: Nile, Dorning LPN.   I discussed the limitations, risks, security and privacy concerns of performing an evaluation and management service by telephone and the availability of in person appointments. I also discussed with the patient that there may be a patient responsible charge related to this service. The patient expressed understanding and verbally consented to this telephonic visit.    Interactive audio and video telecommunications were attempted between this provider and patient, however failed, due to patient having technical difficulties OR patient did not have access to video capability.  We continued and completed visit with audio only.     Vital signs may be patient reported or missing.  Subjective:   Chelsea Hughes is a 69 y.o. female who presents for Medicare Annual (Subsequent) preventive examination.  Review of Systems     Cardiac Risk Factors include: advanced age (>53men, >31 women);hypertension     Objective:    Today's Vitals   12/25/19 0811  Weight: 115 lb (52.2 kg)  Height: 5' (1.524 m)   Body mass index is 22.46 kg/m.  Advanced Directives 12/25/2019 05/05/2019 12/05/2018 12/02/2017 11/29/2015  Does Patient Have a Medical Advance Directive? No No No No No  Would patient like information on creating a medical advance directive? - No - Patient declined - Yes (MAU/Ambulatory/Procedural Areas - Information given) Yes - Educational materials given    Current Medications (verified) Outpatient Encounter Medications as of 12/25/2019  Medication Sig  . aspirin EC 81 MG tablet Take 81 mg by mouth daily.  . Biotin 16109 MCG TABS Take by mouth.  . Calcium Carbonate-Vitamin D 600-400 MG-UNIT tablet Take by mouth.  . Flaxseed, Linseed,  (FLAXSEED OIL) 1000 MG CAPS Take 1,000 mg by mouth daily.  Marland Kitchen lisinopril (ZESTRIL) 5 MG tablet Take 1 tablet (5 mg total) by mouth daily.  Marland Kitchen loratadine (CLARITIN) 10 MG tablet Take 10 mg by mouth daily.  . Multiple Vitamins-Minerals (ONE DAILY CALCIUM/IRON) TABS Take by mouth.  . Omega-3 Fatty Acids (FISH OIL) 1200 MG CAPS Take 1,200 mg by mouth daily.  . Turmeric POWD Take by mouth.  . Calcium Carbonate (CALCIUM 600 PO) Take 600 mg by mouth 2 (two) times daily. (Patient not taking: Reported on 12/25/2019)  . fluticasone (FLONASE) 50 MCG/ACT nasal spray Place 2 sprays into both nostrils daily. (Patient not taking: Reported on 07/10/2019)   No facility-administered encounter medications on file as of 12/25/2019.    Allergies (verified) Patient has no known allergies.   History: Past Medical History:  Diagnosis Date  . Atypical squamous cell changes of undetermined significance (ASCUS) on cervical cytology with positive high risk human papilloma virus (HPV)   . Diverticulosis    on Colonoscopy 2007; CT scan 2014  . Microscopic hematuria    evaluated by Urology in 2014, abd/pelvis CT scan Dec 2014  . Osteoporosis    Past Surgical History:  Procedure Laterality Date  . MANDIBLE SURGERY  04/2019   Family History  Problem Relation Age of Onset  . Heart disease Mother        CABG  . Hypertension Mother   . Osteoporosis Mother   . Thyroid disease Mother   . Heart disease Father   . Hypertension Father   . Cancer Father        throat (smoker)  .  COPD Neg Hx   . Diabetes Neg Hx   . Stroke Neg Hx   . Breast cancer Neg Hx    Social History   Socioeconomic History  . Marital status: Widowed    Spouse name: Not on file  . Number of children: Not on file  . Years of education: Not on file  . Highest education level: Not on file  Occupational History  . Not on file  Tobacco Use  . Smoking status: Never Smoker  . Smokeless tobacco: Never Used  Vaping Use  . Vaping Use: Never  used  Substance and Sexual Activity  . Alcohol use: Yes    Alcohol/week: 7.0 standard drinks    Types: 7 Glasses of wine per week    Comment: wine  . Drug use: No  . Sexual activity: Not Currently  Other Topics Concern  . Not on file  Social History Narrative  . Not on file   Social Determinants of Health   Financial Resource Strain: Low Risk   . Difficulty of Paying Living Expenses: Not hard at all  Food Insecurity: No Food Insecurity  . Worried About Programme researcher, broadcasting/film/videounning Out of Food in the Last Year: Never true  . Ran Out of Food in the Last Year: Never true  Transportation Needs: No Transportation Needs  . Lack of Transportation (Medical): No  . Lack of Transportation (Non-Medical): No  Physical Activity: Sufficiently Active  . Days of Exercise per Week: 5 days  . Minutes of Exercise per Session: 30 min  Stress: No Stress Concern Present  . Feeling of Stress : Not at all  Social Connections:   . Frequency of Communication with Friends and Family: Not on file  . Frequency of Social Gatherings with Friends and Family: Not on file  . Attends Religious Services: Not on file  . Active Member of Clubs or Organizations: Not on file  . Attends BankerClub or Organization Meetings: Not on file  . Marital Status: Not on file    Tobacco Counseling Counseling given: Not Answered   Clinical Intake:  Pre-visit preparation completed: Yes  Pain : No/denies pain     Nutritional Risks: None Diabetes: No  How often do you need to have someone help you when you read instructions, pamphlets, or other written materials from your doctor or pharmacy?: 1 - Never What is the last grade level you completed in school?: graduate  Diabetic? no  Interpreter Needed?: No  Information entered by :: NAllen LPN   Activities of Daily Living In your present state of health, do you have any difficulty performing the following activities: 12/25/2019  Hearing? N  Vision? N  Difficulty concentrating or making  decisions? N  Walking or climbing stairs? N  Dressing or bathing? N  Doing errands, shopping? N  Preparing Food and eating ? N  Using the Toilet? N  In the past six months, have you accidently leaked urine? N  Do you have problems with loss of bowel control? N  Managing your Medications? N  Managing your Finances? N  Housekeeping or managing your Housekeeping? N  Some recent data might be hidden    Patient Care Team: Dorcas CarrowJohnson, Megan P, DO as PCP - General (Family Medicine) Nancy MarusKleinman, Dawn E, MD (Dermatology)  Indicate any recent Medical Services you may have received from other than [facility] providers in the past year (date may be approximate).     Assessment:   This is a routine wellness examination for Chelsea Hughes.  Hearing/Vision screen  Hearing Screening   125Hz  250Hz  500Hz  1000Hz  2000Hz  3000Hz  4000Hz  6000Hz  8000Hz   Right ear:           Left ear:           Vision Screening Comments: No regular eye exams, Dr.  Dietary issues and exercise activities discussed: Current Exercise Habits: Home exercise routine, Type of exercise: walking, Time (Minutes): 30, Frequency (Times/Week): 5, Weekly Exercise (Minutes/Week): 150  Goals    . Patient Stated     12/25/2019, eat healthy and continue to exercise      Depression Screen PHQ 2/9 Scores 12/25/2019 12/19/2018 12/05/2018 12/02/2017 11/30/2016 11/29/2015 11/12/2014  PHQ - 2 Score 0 0 0 0 0 0 0  PHQ- 9 Score - - - - 0 - -    Fall Risk Fall Risk  12/25/2019 12/19/2018 12/05/2018 12/02/2017 11/29/2015  Falls in the past year? 1 0 0 0 No  Comment tripped while gardening - - - -  Number falls in past yr: 0 0 0 0 -  Injury with Fall? 1 0 0 0 -  Risk for fall due to : History of fall(s);Medication side effect - - - -  Follow up Falls evaluation completed;Education provided;Falls prevention discussed - - - -    FALL RISK PREVENTION PERTAINING TO THE HOME:  Any stairs in or around the home? Yes  If so, are there any without  handrails? No  Home free of loose throw rugs in walkways, pet beds, electrical cords, etc? Yes  Adequate lighting in your home to reduce risk of falls? Yes   ASSISTIVE DEVICES UTILIZED TO PREVENT FALLS:  Life alert? No  Use of a cane, walker or w/c? No  Grab bars in the bathroom? Yes  Shower chair or bench in shower? No  Elevated toilet seat or a handicapped toilet? Yes   TIMED UP AND GO:  Was the test performed? No .     Cognitive Function:     6CIT Screen 12/25/2019 12/02/2017 11/30/2016 11/29/2015  What Year? 0 points 0 points 0 points 0 points  What month? 0 points 0 points 0 points 0 points  What time? 0 points 0 points 0 points 0 points  Count back from 20 0 points 0 points 0 points 0 points  Months in reverse 2 points 0 points - 0 points  Repeat phrase 0 points 0 points 0 points 4 points  Total Score 2 0 - 4    Immunizations Immunization History  Administered Date(s) Administered  . Influenza, High Dose Seasonal PF 11/06/2017, 09/19/2018  . Influenza-Unspecified 09/20/2014, 10/16/2015, 11/09/2016  . PFIZER SARS-COV-2 Vaccination 02/28/2019, 03/21/2019  . Pneumococcal Conjugate-13 01/01/2014  . Pneumococcal Polysaccharide-23 11/29/2015  . Td 08/07/2004  . Tdap 09/29/2010  . Zoster 02/20/2010  . Zoster Recombinat (Shingrix) 11/12/2018    TDAP status: Up to date  Flu Vaccine status: Up to date  Pneumococcal vaccine status: Up to date  Covid-19 vaccine status: Completed vaccines  Qualifies for Shingles Vaccine? Yes   Zostavax completed Yes   Shingrix Completed?: Yes  Screening Tests Health Maintenance  Topic Date Due  . INFLUENZA VACCINE  08/20/2019  . DEXA SCAN  02/26/2020  . TETANUS/TDAP  09/28/2020  . Fecal DNA (Cologuard)  01/06/2021  . MAMMOGRAM  03/01/2021  . COVID-19 Vaccine  Completed  . Hepatitis C Screening  Completed  . PNA vac Low Risk Adult  Completed    Health Maintenance  Health Maintenance Due  Topic Date Due  . INFLUENZA  VACCINE  08/20/2019    Colorectal cancer screening: Type of screening: Cologuard. Completed 01/06/2018. Repeat every 3 years  Mammogram status: Completed 03/02/2019. Repeat every year  Bone Density status: Completed 02/25/2017. Results reflect: Bone density results: OSTEOPENIA. Repeat every 3 years.  Lung Cancer Screening: (Low Dose CT Chest recommended if Age 59-80 years, 30 pack-year currently smoking OR have quit w/in 15years.) does not qualify.   Lung Cancer Screening Referral: no  Additional Screening:  Hepatitis C Screening: does qualify; Completed 11/29/2015  Vision Screening: Recommended annual ophthalmology exams for early detection of glaucoma and other disorders of the eye. Is the patient up to date with their annual eye exam?  No  Who is the provider or what is the name of the office in which the patient attends annual eye exams? Dr. Clydene Pugh If pt is not established with a provider, would they like to be referred to a provider to establish care? No .   Dental Screening: Recommended annual dental exams for proper oral hygiene  Community Resource Referral / Chronic Care Management: CRR required this visit?  No   CCM required this visit?  No      Plan:     I have personally reviewed and noted the following in the patient's chart:   . Medical and social history . Use of alcohol, tobacco or illicit drugs  . Current medications and supplements . Functional ability and status . Nutritional status . Physical activity . Advanced directives . List of other physicians . Hospitalizations, surgeries, and ER visits in previous 12 months . Vitals . Screenings to include cognitive, depression, and falls . Referrals and appointments  In addition, I have reviewed and discussed with patient certain preventive protocols, quality metrics, and best practice recommendations. A written personalized care plan for preventive services as well as general preventive health  recommendations were provided to patient.     Barb Merino, LPN   01/25/5535   Nurse Notes:

## 2019-12-25 NOTE — Patient Instructions (Signed)
Chelsea Hughes , Thank you for taking time to come for your Medicare Wellness Visit. I appreciate your ongoing commitment to your health goals. Please review the following plan we discussed and let me know if I can assist you in the future.   Screening recommendations/referrals: Colonoscopy: cologuard 01/06/2018, due 01/06/2021 Mammogram: completed 03/02/2019, due 03/01/2020 Bone Density: completed 02/25/2017 Recommended yearly ophthalmology/optometry visit for glaucoma screening and checkup Recommended yearly dental visit for hygiene and checkup  Vaccinations: Influenza vaccine: completed 10/16/2019 Pneumococcal vaccine: completed 11/29/2015 Tdap vaccine: completed 09/29/2010, due 09/28/2020 Shingles vaccine: completed   Covid-19: 03/21/2019, 02/28/2019, 11/02/2019  Advanced directives: Advance directive discussed with you today. Even though you declined this today please call our office should you change your mind and we can give you the proper paperwork for you to fill out.  Conditions/risks identified: none  Next appointment: Follow up in one year for your annual wellness visit    Preventive Care 65 Years and Older, Female Preventive care refers to lifestyle choices and visits with your health care provider that can promote health and wellness. What does preventive care include?  A yearly physical exam. This is also called an annual well check.  Dental exams once or twice a year.  Routine eye exams. Ask your health care provider how often you should have your eyes checked.  Personal lifestyle choices, including:  Daily care of your teeth and gums.  Regular physical activity.  Eating a healthy diet.  Avoiding tobacco and drug use.  Limiting alcohol use.  Practicing safe sex.  Taking low-dose aspirin every day.  Taking vitamin and mineral supplements as recommended by your health care provider. What happens during an annual well check? The services and screenings done by your  health care provider during your annual well check will depend on your age, overall health, lifestyle risk factors, and family history of disease. Counseling  Your health care provider may ask you questions about your:  Alcohol use.  Tobacco use.  Drug use.  Emotional well-being.  Home and relationship well-being.  Sexual activity.  Eating habits.  History of falls.  Memory and ability to understand (cognition).  Work and work Astronomer.  Reproductive health. Screening  You may have the following tests or measurements:  Height, weight, and BMI.  Blood pressure.  Lipid and cholesterol levels. These may be checked every 5 years, or more frequently if you are over 47 years old.  Skin check.  Lung cancer screening. You may have this screening every year starting at age 69 if you have a 30-pack-year history of smoking and currently smoke or have quit within the past 15 years.  Fecal occult blood test (FOBT) of the stool. You may have this test every year starting at age 45.  Flexible sigmoidoscopy or colonoscopy. You may have a sigmoidoscopy every 5 years or a colonoscopy every 10 years starting at age 28.  Hepatitis C blood test.  Hepatitis B blood test.  Sexually transmitted disease (STD) testing.  Diabetes screening. This is done by checking your blood sugar (glucose) after you have not eaten for a while (fasting). You may have this done every 1-3 years.  Bone density scan. This is done to screen for osteoporosis. You may have this done starting at age 25.  Mammogram. This may be done every 1-2 years. Talk to your health care provider about how often you should have regular mammograms. Talk with your health care provider about your test results, treatment options, and if necessary, the  need for more tests. Vaccines  Your health care provider may recommend certain vaccines, such as:  Influenza vaccine. This is recommended every year.  Tetanus, diphtheria, and  acellular pertussis (Tdap, Td) vaccine. You may need a Td booster every 10 years.  Zoster vaccine. You may need this after age 43.  Pneumococcal 13-valent conjugate (PCV13) vaccine. One dose is recommended after age 82.  Pneumococcal polysaccharide (PPSV23) vaccine. One dose is recommended after age 25. Talk to your health care provider about which screenings and vaccines you need and how often you need them. This information is not intended to replace advice given to you by your health care provider. Make sure you discuss any questions you have with your health care provider. Document Released: 02/01/2015 Document Revised: 09/25/2015 Document Reviewed: 11/06/2014 Elsevier Interactive Patient Education  2017 [employer] Prevention in the Home Falls can cause injuries. They can happen to people of all ages. There are many things you can do to make your home safe and to help prevent falls. What can I do on the outside of my home?  Regularly fix the edges of walkways and driveways and fix any cracks.  Remove anything that might make you trip as you walk through a door, such as a raised step or threshold.  Trim any bushes or trees on the path to your home.  Use bright outdoor lighting.  Clear any walking paths of anything that might make someone trip, such as rocks or tools.  Regularly check to see if handrails are loose or broken. Make sure that both sides of any steps have handrails.  Any raised decks and porches should have guardrails on the edges.  Have any leaves, snow, or ice cleared regularly.  Use sand or salt on walking paths during winter.  Clean up any spills in your garage right away. This includes oil or grease spills. What can I do in the bathroom?  Use night lights.  Install grab bars by the toilet and in the tub and shower. Do not use towel bars as grab bars.  Use non-skid mats or decals in the tub or shower.  If you need to sit down in the shower, use  a plastic, non-slip stool.  Keep the floor dry. Clean up any water that spills on the floor as soon as it happens.  Remove soap buildup in the tub or shower regularly.  Attach bath mats securely with double-sided non-slip rug tape.  Do not have throw rugs and other things on the floor that can make you trip. What can I do in the bedroom?  Use night lights.  Make sure that you have a light by your bed that is easy to reach.  Do not use any sheets or blankets that are too big for your bed. They should not hang down onto the floor.  Have a firm chair that has side arms. You can use this for support while you get dressed.  Do not have throw rugs and other things on the floor that can make you trip. What can I do in the kitchen?  Clean up any spills right away.  Avoid walking on wet floors.  Keep items that you use a lot in easy-to-reach places.  If you need to reach something above you, use a strong step stool that has a grab bar.  Keep electrical cords out of the way.  Do not use floor polish or wax that makes floors slippery. If you must use wax,  use non-skid floor wax.  Do not have throw rugs and other things on the floor that can make you trip. What can I do with my stairs?  Do not leave any items on the stairs.  Make sure that there are handrails on both sides of the stairs and use them. Fix handrails that are broken or loose. Make sure that handrails are as long as the stairways.  Check any carpeting to make sure that it is firmly attached to the stairs. Fix any carpet that is loose or worn.  Avoid having throw rugs at the top or bottom of the stairs. If you do have throw rugs, attach them to the floor with carpet tape.  Make sure that you have a light switch at the top of the stairs and the bottom of the stairs. If you do not have them, ask someone to add them for you. What else can I do to help prevent falls?  Wear shoes that:  Do not have high heels.  Have  rubber bottoms.  Are comfortable and fit you well.  Are closed at the toe. Do not wear sandals.  If you use a stepladder:  Make sure that it is fully opened. Do not climb a closed stepladder.  Make sure that both sides of the stepladder are locked into place.  Ask someone to hold it for you, if possible.  Clearly mark and make sure that you can see:  Any grab bars or handrails.  First and last steps.  Where the edge of each step is.  Use tools that help you move around (mobility aids) if they are needed. These include:  Canes.  Walkers.  Scooters.  Crutches.  Turn on the lights when you go into a dark area. Replace any light bulbs as soon as they burn out.  Set up your furniture so you have a clear path. Avoid moving your furniture around.  If any of your floors are uneven, fix them.  If there are any pets around you, be aware of where they are.  Review your medicines with your doctor. Some medicines can make you feel dizzy. This can increase your chance of falling. Ask your doctor what other things that you can do to help prevent falls. This information is not intended to replace advice given to you by your health care provider. Make sure you discuss any questions you have with your health care provider. Document Released: 11/01/2008 Document Revised: 06/13/2015 Document Reviewed: 02/09/2014 Elsevier Interactive Patient Education  2017 [employer].

## 2020-02-13 ENCOUNTER — Ambulatory Visit (INDEPENDENT_AMBULATORY_CARE_PROVIDER_SITE_OTHER): Payer: Medicare PPO | Admitting: Family Medicine

## 2020-02-13 ENCOUNTER — Other Ambulatory Visit: Payer: Self-pay

## 2020-02-13 ENCOUNTER — Encounter: Payer: Self-pay | Admitting: Family Medicine

## 2020-02-13 VITALS — BP 126/71 | HR 69 | Temp 97.7°F | Ht 60.25 in | Wt 116.6 lb

## 2020-02-13 DIAGNOSIS — Z0000 Encounter for general adult medical examination without abnormal findings: Secondary | ICD-10-CM | POA: Diagnosis not present

## 2020-02-13 DIAGNOSIS — Z13820 Encounter for screening for osteoporosis: Secondary | ICD-10-CM

## 2020-02-13 DIAGNOSIS — I10 Essential (primary) hypertension: Secondary | ICD-10-CM | POA: Diagnosis not present

## 2020-02-13 DIAGNOSIS — J3089 Other allergic rhinitis: Secondary | ICD-10-CM

## 2020-02-13 DIAGNOSIS — E782 Mixed hyperlipidemia: Secondary | ICD-10-CM

## 2020-02-13 DIAGNOSIS — J309 Allergic rhinitis, unspecified: Secondary | ICD-10-CM | POA: Insufficient documentation

## 2020-02-13 DIAGNOSIS — M8580 Other specified disorders of bone density and structure, unspecified site: Secondary | ICD-10-CM | POA: Diagnosis not present

## 2020-02-13 DIAGNOSIS — R3129 Other microscopic hematuria: Secondary | ICD-10-CM

## 2020-02-13 DIAGNOSIS — Z1231 Encounter for screening mammogram for malignant neoplasm of breast: Secondary | ICD-10-CM | POA: Diagnosis not present

## 2020-02-13 LAB — URINALYSIS, ROUTINE W REFLEX MICROSCOPIC
Bilirubin, UA: NEGATIVE
Glucose, UA: NEGATIVE
Ketones, UA: NEGATIVE
Leukocytes,UA: NEGATIVE
Nitrite, UA: NEGATIVE
Protein,UA: NEGATIVE
Specific Gravity, UA: 1.015 (ref 1.005–1.030)
Urobilinogen, Ur: 0.2 mg/dL (ref 0.2–1.0)
pH, UA: 8.5 — ABNORMAL HIGH (ref 5.0–7.5)

## 2020-02-13 LAB — MICROSCOPIC EXAMINATION: Bacteria, UA: NONE SEEN

## 2020-02-13 LAB — MICROALBUMIN, URINE WAIVED
Creatinine, Urine Waived: 50 mg/dL (ref 10–300)
Microalb, Ur Waived: 10 mg/L (ref 0–19)

## 2020-02-13 MED ORDER — LISINOPRIL 5 MG PO TABS: 5.0000 mg | ORAL_TABLET | Freq: Every day | ORAL | 1 refills | Status: DC

## 2020-02-13 MED ORDER — FLUTICASONE PROPIONATE 50 MCG/ACT NA SUSP: 2.0000 | Freq: Every day | NASAL | 3 refills | Status: DC

## 2020-02-13 NOTE — Progress Notes (Signed)
BP 126/71   Pulse 69   Temp 97.7 F (36.5 C)   Ht 5' 0.25" (1.53 m)   Wt 116 lb 9.6 oz (52.9 kg)   SpO2 100%   BMI 22.58 kg/m    Subjective:    Patient ID: Chelsea Hughes, female    DOB: 11/03/1950, 70 y.o.   MRN: [id number]  HPI: Chelsea Hughes is a 70 y.o. female presenting on 02/13/2020 for comprehensive medical examination. Current medical complaints include:  HYPERTENSION / HYPERLIPIDEMIA Satisfied with current treatment? yes Duration of hypertension: chronic BP monitoring frequency: not checking BP medication side effects: no Past BP meds: lisinopril Duration of hyperlipidemia: chronic Cholesterol medication side effects: not on anything Cholesterol supplements: none Past cholesterol medications: fish oil, flaxseed oil Medication compliance: excellent compliance Aspirin: yes Recent stressors: no Recurrent headaches: no Visual changes: no Palpitations: no Dyspnea: no Chest pain: no Lower extremity edema: no Dizzy/lightheaded: no  Menopausal Symptoms: no  Depression Screen done today and results listed below:  [facility] 02/13/2020 12/25/2019 12/19/2018 12/05/2018 12/02/2017  Decreased Interest 0 0 0 0 0  Down, Depressed, Hopeless 0 0 0 0 0  PHQ - 2 Score 0 0 0 0 0  Altered sleeping - - - - -  Tired, decreased energy - - - - -  Change in appetite - - - - -  Feeling bad or failure about yourself  - - - - -  Trouble concentrating - - - - -  Moving slowly or fidgety/restless - - - - -  Suicidal thoughts - - - - -  PHQ-9 Score - - - - -  Difficult doing work/chores - - - - -    Past Medical History:  Past Medical History:  Diagnosis Date  . Atypical squamous cell changes of undetermined significance (ASCUS) on cervical cytology with positive high risk human papilloma virus (HPV)   . Diverticulosis    on Colonoscopy 2007; CT scan 2014  . Microscopic hematuria    evaluated by Urology in 2014, abd/pelvis CT scan Dec 2014  . Osteoporosis      Surgical History:  Past Surgical History:  Procedure Laterality Date  . MANDIBLE SURGERY  04/2019    Medications:  Current Outpatient Medications on File Prior to Visit  Medication Sig  . aspirin EC 81 MG tablet Take 81 mg by mouth daily.  . Biotin [id number] MCG TABS Take by mouth.  . Calcium Carbonate (CALCIUM 600 PO) Take 600 mg by mouth 2 (two) times daily.  . Calcium Carbonate-Vitamin D 600-400 MG-UNIT tablet Take by mouth.  . Flaxseed, Linseed, (FLAXSEED OIL) 1000 MG CAPS Take 1,000 mg by mouth daily.  Marland Kitchen. loratadine (CLARITIN) 10 MG tablet Take 10 mg by mouth daily.  . Multiple Vitamins-Minerals (ONE DAILY CALCIUM/IRON) TABS Take by mouth.  . Omega-3 Fatty Acids (FISH OIL) 1200 MG CAPS Take 1,200 mg by mouth daily.  . Turmeric POWD Take by mouth.   No current facility-administered medications on file prior to visit.    Allergies:  No Known Allergies  Social History:  Social History   Socioeconomic History  . Marital status: Widowed    Spouse name: Not on file  . Number of children: Not on file  . Years of education: Not on file  . Highest education level: Not on file  Occupational History  . Not on file  Tobacco Use  . Smoking status: Never Smoker  . Smokeless tobacco: Never Used  Vaping Use  .  Vaping Use: Never used  Substance and Sexual Activity  . Alcohol use: Yes    Alcohol/week: 7.0 standard drinks    Types: 7 Glasses of wine per week    Comment: wine  . Drug use: No  . Sexual activity: Not Currently  Other Topics Concern  . Not on file  Social History Narrative  . Not on file   Social Determinants of Health   Financial Resource Strain: Low Risk   . Difficulty of Paying Living Expenses: Not hard at all  Food Insecurity: No Food Insecurity  . Worried About Programme researcher, broadcasting/film/video in the Last Year: Never true  . Ran Out of Food in the Last Year: Never true  Transportation Needs: No Transportation Needs  . Lack of Transportation (Medical): No  . Lack  of Transportation (Non-Medical): No  Physical Activity: Sufficiently Active  . Days of Exercise per Week: 5 days  . Minutes of Exercise per Session: 30 min  Stress: No Stress Concern Present  . Feeling of Stress : Not at all  Social Connections: Not on file  Intimate Partner Violence: Not on file   Social History   Tobacco Use  Smoking Status Never Smoker  Smokeless Tobacco Never Used   Social History   Substance and Sexual Activity  Alcohol Use Yes  . Alcohol/week: 7.0 standard drinks  . Types: 7 Glasses of wine per week   Comment: wine    Family History:  Family History  Problem Relation Age of Onset  . Heart disease Mother        CABG  . Hypertension Mother   . Osteoporosis Mother   . Thyroid disease Mother   . Heart disease Father   . Hypertension Father   . Cancer Father        throat (smoker)  . COPD Neg Hx   . Diabetes Neg Hx   . Stroke Neg Hx   . Breast cancer Neg Hx     Past medical history, surgical history, medications, allergies, family history and social history reviewed with patient today and changes made to appropriate areas of the chart.   Review of Systems  Constitutional: Negative.   HENT: Negative.  Negative for congestion, ear discharge, ear pain, hearing loss, nosebleeds, sinus pain, sore throat and tinnitus.        Runny nose   Eyes: Negative.   Respiratory: Negative.  Negative for stridor.   Cardiovascular: Negative.   Gastrointestinal: Negative.   Genitourinary: Negative.   Musculoskeletal: Negative.   Skin: Negative.   Neurological: Negative.   Endo/Heme/Allergies: Positive for environmental allergies. Negative for polydipsia. Does not bruise/bleed easily.  Psychiatric/Behavioral: Negative.    All other ROS negative except what is listed above and in the HPI.      Objective:    BP 126/71   Pulse 69   Temp 97.7 F (36.5 C)   Ht 5' 0.25" (1.53 m)   Wt 116 lb 9.6 oz (52.9 kg)   SpO2 100%   BMI 22.58 kg/m   Wt Readings  from Last 3 Encounters:  02/13/20 116 lb 9.6 oz (52.9 kg)  12/25/19 115 lb (52.2 kg)  08/07/19 114 lb (51.7 kg)    Physical Exam Vitals and nursing note reviewed.  Constitutional:      General: She is not in acute distress.    Appearance: Normal appearance. She is not ill-appearing, toxic-appearing or diaphoretic.  HENT:     Head: Normocephalic and atraumatic.     Right  Ear: Tympanic membrane, ear canal and external ear normal. There is no impacted cerumen.     Left Ear: Tympanic membrane, ear canal and external ear normal. There is no impacted cerumen.     Nose: Nose normal. No congestion or rhinorrhea.     Mouth/Throat:     Mouth: Mucous membranes are moist.     Pharynx: Oropharynx is clear. No oropharyngeal exudate or posterior oropharyngeal erythema.  Eyes:     General: No scleral icterus.       Right eye: No discharge.        Left eye: No discharge.     Extraocular Movements: Extraocular movements intact.     Conjunctiva/sclera: Conjunctivae normal.     Pupils: Pupils are equal, round, and reactive to light.  Neck:     Vascular: No carotid bruit.  Cardiovascular:     Rate and Rhythm: Normal rate and regular rhythm.     Pulses: Normal pulses.     Heart sounds: No murmur heard. No friction rub. No gallop.   Pulmonary:     Effort: Pulmonary effort is normal. No respiratory distress.     Breath sounds: Normal breath sounds. No stridor. No wheezing, rhonchi or rales.  Chest:     Chest wall: No tenderness.  Abdominal:     General: Abdomen is flat. Bowel sounds are normal. There is no distension.     Palpations: Abdomen is soft. There is no mass.     Tenderness: There is no abdominal tenderness. There is no right CVA tenderness, left CVA tenderness, guarding or rebound.     Hernia: No hernia is present.  Genitourinary:    Comments: Breast and pelvic exams deferred with shared decision making Musculoskeletal:        General: No swelling, tenderness, deformity or signs of  injury.     Cervical back: Normal range of motion and neck supple. No rigidity. No muscular tenderness.     Right lower leg: No edema.     Left lower leg: No edema.  Lymphadenopathy:     Cervical: No cervical adenopathy.  Skin:    General: Skin is warm and dry.     Capillary Refill: Capillary refill takes less than 2 seconds.     Coloration: Skin is not jaundiced or pale.     Findings: No bruising, erythema, lesion or rash.  Neurological:     General: No focal deficit present.     Mental Status: She is alert and oriented to person, place, and time. Mental status is at baseline.     Cranial Nerves: No cranial nerve deficit.     Sensory: No sensory deficit.     Motor: No weakness.     Coordination: Coordination normal.     Gait: Gait normal.     Deep Tendon Reflexes: Reflexes normal.  Psychiatric:        Mood and Affect: Mood normal.        Behavior: Behavior normal.        Thought Content: Thought content normal.        Judgment: Judgment normal.     Results for orders placed or performed in visit on 08/07/19  Basic metabolic panel  Result Value Ref Range   Glucose 81 65 - 99 mg/dL   BUN 17 8 - 27 mg/dL   Creatinine, Ser 7.40 0.57 - 1.00 mg/dL   GFR calc non Af Amer 75 >59 mL/min/1.73   GFR calc Af Amer 86 >59 mL/min/1.73  BUN/Creatinine Ratio 21 12 - 28   Sodium 141 134 - 144 mmol/L   Potassium 5.5 (H) 3.5 - 5.2 mmol/L   Chloride 101 96 - 106 mmol/L   CO2 26 20 - 29 mmol/L   Calcium 9.9 8.7 - 10.3 mg/dL      Assessment & Plan:   Problem List Items Addressed This Visit      Cardiovascular and Mediastinum   HTN (hypertension)    Under good control on current regimen. Continue current regimen. Continue to monitor. Call with any concerns. Refills given.        Relevant Medications   lisinopril (ZESTRIL) 5 MG tablet   Other Relevant Orders   Comprehensive metabolic panel   Microalbumin, Urine Waived   TSH     Respiratory   Allergic rhinitis    Under good  control on current regimen. Continue current regimen. Continue to monitor. Call with any concerns. Refills given.        Relevant Medications   fluticasone (FLONASE) 50 MCG/ACT nasal spray     Musculoskeletal and Integument   Osteopenia    Due for recheck on DEXA- ordered today.      Relevant Orders   DG Bone Density     Genitourinary   Microscopic hematuria    Stable. Continue to monitor. Call with any concerns.       Relevant Orders   CBC with Differential/Platelet   Comprehensive metabolic panel   UA/M w/rflx Culture, Routine   Urinalysis, Routine w reflex microscopic     Other   Hyperlipidemia    Rechecking levels today. Await results. Treat as needed.        Relevant Medications   lisinopril (ZESTRIL) 5 MG tablet   Other Relevant Orders   Comprehensive metabolic panel   Lipid Panel w/o Chol/HDL Ratio    Other Visit Diagnoses    Routine general medical examination at a health care facility    -  Primary   Vaccines up to date. Screening labs checked today. DEXA and Mammo ordered. Cologuard up to date. Continue diet and exercise. Call with any concerns.    Encounter for screening mammogram for malignant neoplasm of breast       Mammogram ordered today.   Relevant Orders   MM 3D SCREEN BREAST BILATERAL   Screening for osteoporosis       DEXA ordered today.   Relevant Orders   DG Bone Density       Follow up plan: Return in about 6 months (around 08/12/2020).   LABORATORY TESTING:  - Pap smear: not applicable  IMMUNIZATIONS:   - Tdap: Tetanus vaccination status reviewed: last tetanus booster within 10 years. - Influenza: Up to date - Pneumovax: Up to date - Prevnar: Up to date  SCREENING: -Mammogram: Ordered today  - Colonoscopy: Up to date  - Bone Density: Ordered today   PATIENT COUNSELING:   Advised to take 1 mg of folate supplement per day if capable of pregnancy.   Sexuality: Discussed sexually transmitted diseases, partner selection, use  of condoms, avoidance of unintended pregnancy  and contraceptive alternatives.   Advised to avoid cigarette smoking.  I discussed with the patient that most people either abstain from alcohol or drink within safe limits (<=14/week and <=4 drinks/occasion for males, <=7/weeks and <= 3 drinks/occasion for females) and that the risk for alcohol disorders and other health effects rises proportionally with the number of drinks per week and how often a drinker exceeds daily limits.  Discussed cessation/primary prevention of drug use and availability of treatment for abuse.   Diet: Encouraged to adjust caloric intake to maintain  or achieve ideal body weight, to reduce intake of dietary saturated fat and total fat, to limit sodium intake by avoiding high sodium foods and not adding table salt, and to maintain adequate dietary potassium and calcium preferably from fresh fruits, vegetables, and low-fat dairy products.    stressed the importance of regular exercise  Injury prevention: Discussed safety belts, safety helmets, smoke detector, smoking near bedding or upholstery.   Dental health: Discussed importance of regular tooth brushing, flossing, and dental visits.    NEXT PREVENTATIVE PHYSICAL DUE IN 1 YEAR. Return in about 6 months (around 08/12/2020).

## 2020-02-13 NOTE — Assessment & Plan Note (Signed)
Rechecking levels today. Await results. Treat as needed.  

## 2020-02-13 NOTE — Assessment & Plan Note (Signed)
Under good control on current regimen. Continue current regimen. Continue to monitor. Call with any concerns. Refills given.   

## 2020-02-13 NOTE — Patient Instructions (Addendum)
Please call to schedule your mammogram and bone density: [facility]  Address: [address] [place], [place], [place] [address]  Phone: [phone]   Health Maintenance After Age 72 After age 32, you are at a higher risk for certain long-term diseases and infections as well as injuries from falls. Falls are a major cause of broken bones and head injuries in people who are older than age 64. Getting regular preventive care can help to keep you healthy and well. Preventive care includes getting regular testing and making lifestyle changes as recommended by your health care provider. Talk with your health care provider about:  Which screenings and tests you should have. A screening is a test that checks for a disease when you have no symptoms.  A diet and exercise plan that is right for you. What should I know about screenings and tests to prevent falls? Screening and testing are the best ways to find a health problem early. Early diagnosis and treatment give you the best chance of managing medical conditions that are common after age 2. Certain conditions and lifestyle choices may make you more likely to have a fall. Your health care provider may recommend:  Regular vision checks. Poor vision and conditions such as cataracts can make you more likely to have a fall. If you wear glasses, make sure to get your prescription updated if your vision changes.  Medicine review. Work with your health care provider to regularly review all of the medicines you are taking, including over-the-counter medicines. Ask your health care provider about any side effects that may make you more likely to have a fall. Tell your health care provider if any medicines that you take make you feel dizzy or sleepy.  Osteoporosis screening. Osteoporosis is a condition that causes the bones to get weaker. This can make the bones weak and cause them to break more easily.  Blood pressure  screening. Blood pressure changes and medicines to control blood pressure can make you feel dizzy.  Strength and balance checks. Your health care provider may recommend certain tests to check your strength and balance while standing, walking, or changing positions.  Foot health exam. Foot pain and numbness, as well as not wearing proper footwear, can make you more likely to have a fall.  Depression screening. You may be more likely to have a fall if you have a fear of falling, feel emotionally low, or feel unable to do activities that you used to do.  Alcohol use screening. Using too much alcohol can affect your balance and may make you more likely to have a fall. What actions can I take to lower my risk of falls? General instructions  Talk with your health care provider about your risks for falling. Tell your health care provider if: ? You fall. Be sure to tell your health care provider about all falls, even ones that seem minor. ? You feel dizzy, sleepy, or off-balance.  Take over-the-counter and prescription medicines only as told by your health care provider. These include any supplements.  Eat a healthy diet and maintain a healthy weight. A healthy diet includes low-fat dairy products, low-fat (lean) meats, and fiber from whole grains, beans, and lots of fruits and vegetables. Home safety  Remove any tripping hazards, such as rugs, cords, and clutter.  Install safety equipment such as grab bars in bathrooms and safety rails on stairs.  Keep rooms and walkways well-lit. Activity  Follow a regular exercise program  to stay fit. This will help you maintain your balance. Ask your health care provider what types of exercise are appropriate for you.  If you need a cane or walker, use it as recommended by your health care provider.  Wear supportive shoes that have nonskid soles.   Lifestyle  Do not drink alcohol if your health care provider tells you not to drink.  If you drink  alcohol, limit how much you have: ? 0-1 drink a day for women. ? 0-2 drinks a day for men.  Be aware of how much alcohol is in your drink. In the [place] one drink equals one typical bottle of beer (12 oz), one-half glass of wine (5 oz), or one shot of hard liquor (1 oz).  Do not use any products that contain nicotine or tobacco, such as cigarettes and e-cigarettes. If you need help quitting, ask your health care provider. Summary  Having a healthy lifestyle and getting preventive care can help to protect your health and wellness after age 74.  Screening and testing are the best way to find a health problem early and help you avoid having a fall. Early diagnosis and treatment give you the best chance for managing medical conditions that are more common for people who are older than age 35.  Falls are a major cause of broken bones and head injuries in people who are older than age 38. Take precautions to prevent a fall at home.  Work with your health care provider to learn what changes you can make to improve your health and wellness and to prevent falls. This information is not intended to replace advice given to you by your health care provider. Make sure you discuss any questions you have with your health care provider. Document Revised: 04/28/2018 Document Reviewed: 11/18/2016 Elsevier Patient Education  2021 [employer].

## 2020-02-13 NOTE — Assessment & Plan Note (Signed)
Stable. Continue to monitor. Call with any concerns.  ?

## 2020-02-13 NOTE — Assessment & Plan Note (Signed)
Due for recheck on DEXA- ordered today.

## 2020-02-14 LAB — CBC WITH DIFFERENTIAL/PLATELET
Basophils Absolute: 0 10*3/uL (ref 0.0–0.2)
Basos: 1 %
EOS (ABSOLUTE): 0.1 10*3/uL (ref 0.0–0.4)
Eos: 2 %
Hematocrit: 43 % (ref 34.0–46.6)
Hemoglobin: 14.6 g/dL (ref 11.1–15.9)
Immature Grans (Abs): 0 10*3/uL (ref 0.0–0.1)
Immature Granulocytes: 0 %
Lymphocytes Absolute: 1.5 10*3/uL (ref 0.7–3.1)
Lymphs: 25 %
MCH: 31.3 pg (ref 26.6–33.0)
MCHC: 34 g/dL (ref 31.5–35.7)
MCV: 92 fL (ref 79–97)
Monocytes Absolute: 0.5 10*3/uL (ref 0.1–0.9)
Monocytes: 9 %
Neutrophils Absolute: 3.9 10*3/uL (ref 1.4–7.0)
Neutrophils: 63 %
Platelets: 288 10*3/uL (ref 150–450)
RBC: 4.66 x10E6/uL (ref 3.77–5.28)
RDW: 12.5 % (ref 11.7–15.4)
WBC: 6.1 10*3/uL (ref 3.4–10.8)

## 2020-02-14 LAB — COMPREHENSIVE METABOLIC PANEL
ALT: 10 IU/L (ref 0–32)
AST: 20 IU/L (ref 0–40)
Albumin/Globulin Ratio: 2.1 (ref 1.2–2.2)
Albumin: 4.5 g/dL (ref 3.7–4.7)
Alkaline Phosphatase: 50 IU/L (ref 44–121)
BUN/Creatinine Ratio: 16 (ref 12–28)
BUN: 14 mg/dL (ref 8–27)
Bilirubin Total: 0.4 mg/dL (ref 0.0–1.2)
CO2: 26 mmol/L (ref 20–29)
Calcium: 9.7 mg/dL (ref 8.7–10.3)
Chloride: 101 mmol/L (ref 96–106)
Creatinine, Ser: 0.87 mg/dL (ref 0.57–1.00)
GFR calc Af Amer: 78 mL/min/{1.73_m2} (ref >59)
GFR calc non Af Amer: 67 mL/min/{1.73_m2} (ref >59)
Globulin, Total: 2.1 g/dL (ref 1.5–4.5)
Glucose: 77 mg/dL (ref 65–99)
Potassium: 4.6 mmol/L (ref 3.5–5.2)
Sodium: 141 mmol/L (ref 134–144)
Total Protein: 6.6 g/dL (ref 6.0–8.5)

## 2020-02-14 LAB — LIPID PANEL W/O CHOL/HDL RATIO
Cholesterol, Total: 203 mg/dL — ABNORMAL HIGH (ref 100–199)
HDL: 58 mg/dL (ref >39)
LDL Chol Calc (NIH): 123 mg/dL — ABNORMAL HIGH (ref 0–99)
Triglycerides: 127 mg/dL (ref 0–149)
VLDL Cholesterol Cal: 22 mg/dL (ref 5–40)

## 2020-02-14 LAB — TSH: TSH: 4.59 u[IU]/mL — ABNORMAL HIGH (ref 0.450–4.500)

## 2020-02-19 ENCOUNTER — Other Ambulatory Visit: Payer: Self-pay | Admitting: Family Medicine

## 2020-02-19 DIAGNOSIS — R7989 Other specified abnormal findings of blood chemistry: Secondary | ICD-10-CM

## 2020-02-19 NOTE — Progress Notes (Signed)
Patient notified.  Lab appt scheduled.

## 2020-03-04 ENCOUNTER — Other Ambulatory Visit: Payer: Self-pay

## 2020-03-04 ENCOUNTER — Ambulatory Visit
Admission: RE | Admit: 2020-03-04 | Discharge: 2020-03-04 | Disposition: A | Discharge status: Home / Self Care | Payer: Medicare PPO | Source: Ambulatory Visit | Attending: Family Medicine | Admitting: Family Medicine

## 2020-03-04 DIAGNOSIS — Z780 Asymptomatic menopausal state: Secondary | ICD-10-CM | POA: Insufficient documentation

## 2020-03-04 DIAGNOSIS — M810 Age-related osteoporosis without current pathological fracture: Secondary | ICD-10-CM | POA: Insufficient documentation

## 2020-03-04 DIAGNOSIS — Z1231 Encounter for screening mammogram for malignant neoplasm of breast: Secondary | ICD-10-CM | POA: Diagnosis not present

## 2020-03-04 DIAGNOSIS — Z13820 Encounter for screening for osteoporosis: Secondary | ICD-10-CM | POA: Diagnosis not present

## 2020-03-04 DIAGNOSIS — M8580 Other specified disorders of bone density and structure, unspecified site: Secondary | ICD-10-CM

## 2020-03-18 ENCOUNTER — Other Ambulatory Visit: Payer: Self-pay

## 2020-03-18 ENCOUNTER — Other Ambulatory Visit: Payer: Medicare PPO

## 2020-03-18 DIAGNOSIS — R7989 Other specified abnormal findings of blood chemistry: Secondary | ICD-10-CM

## 2020-03-19 LAB — TSH: TSH: 7 u[IU]/mL — ABNORMAL HIGH (ref 0.450–4.500)

## 2020-03-21 ENCOUNTER — Other Ambulatory Visit: Payer: Self-pay | Admitting: Family Medicine

## 2020-03-21 DIAGNOSIS — E039 Hypothyroidism, unspecified: Secondary | ICD-10-CM

## 2020-03-21 DIAGNOSIS — M810 Age-related osteoporosis without current pathological fracture: Secondary | ICD-10-CM | POA: Insufficient documentation

## 2020-03-21 MED ORDER — LEVOTHYROXINE SODIUM 25 MCG PO TABS: 25.0000 ug | ORAL_TABLET | Freq: Every day | ORAL | 1 refills | Status: DC

## 2020-03-21 MED ORDER — ALENDRONATE SODIUM 70 MG PO TABS: 70.0000 mg | ORAL_TABLET | ORAL | 11 refills | Status: DC

## 2020-05-06 ENCOUNTER — Other Ambulatory Visit: Payer: Self-pay

## 2020-05-06 ENCOUNTER — Other Ambulatory Visit: Payer: Medicare PPO

## 2020-05-06 DIAGNOSIS — E039 Hypothyroidism, unspecified: Secondary | ICD-10-CM | POA: Diagnosis not present

## 2020-05-07 LAB — TSH: TSH: 3.1 u[IU]/mL (ref 0.450–4.500)

## 2020-05-10 ENCOUNTER — Other Ambulatory Visit: Payer: Self-pay | Admitting: Family Medicine

## 2020-05-10 MED ORDER — LEVOTHYROXINE SODIUM 25 MCG PO TABS: 25.0000 ug | ORAL_TABLET | Freq: Every day | ORAL | 3 refills | Status: DC

## 2020-08-12 DIAGNOSIS — D1800 Hemangioma unspecified site: Secondary | ICD-10-CM | POA: Diagnosis not present

## 2020-08-12 DIAGNOSIS — L814 Other melanin hyperpigmentation: Secondary | ICD-10-CM | POA: Diagnosis not present

## 2020-08-12 DIAGNOSIS — L570 Actinic keratosis: Secondary | ICD-10-CM | POA: Diagnosis not present

## 2020-08-13 ENCOUNTER — Encounter: Payer: Self-pay | Admitting: Family Medicine

## 2020-08-13 ENCOUNTER — Other Ambulatory Visit: Payer: Self-pay

## 2020-08-13 ENCOUNTER — Ambulatory Visit: Payer: Medicare PPO | Admitting: Family Medicine

## 2020-08-13 VITALS — BP 138/70 | HR 65 | Temp 98.3°F | Ht 59.6 in | Wt 120.2 lb

## 2020-08-13 DIAGNOSIS — I10 Essential (primary) hypertension: Secondary | ICD-10-CM

## 2020-08-13 DIAGNOSIS — E782 Mixed hyperlipidemia: Secondary | ICD-10-CM

## 2020-08-13 DIAGNOSIS — E039 Hypothyroidism, unspecified: Secondary | ICD-10-CM | POA: Diagnosis not present

## 2020-08-13 MED ORDER — LISINOPRIL 5 MG PO TABS: 5.0000 mg | ORAL_TABLET | Freq: Every day | ORAL | 1 refills | Status: DC

## 2020-08-13 NOTE — Assessment & Plan Note (Signed)
Under good control on current regimen. Continue current regimen. Continue to monitor. Call with any concerns. Refills given. Labs drawn today.   

## 2020-08-13 NOTE — Assessment & Plan Note (Signed)
Rechecking levels today. Tolerating her meds well. Await results. Treat as needed.

## 2020-08-13 NOTE — Progress Notes (Signed)
BP 138/70   Pulse 65   Temp 98.3 F (36.8 C) (Oral)   Ht 4' 11.6" (1.514 m)   Wt 120 lb 3.2 oz (54.5 kg)   SpO2 100%   BMI 23.79 kg/m    Subjective:    Patient ID: Chelsea Hughes, female    DOB: 1950/04/25, 70 y.o.   MRN: [id number]  HPI: Chelsea Hughes is a 70 y.o. female  Chief Complaint  Patient presents with   Hyperlipidemia   Hypertension   Hypothyroidism   HYPERTENSION / HYPERLIPIDEMIA Satisfied with current treatment? yes Duration of hypertension: chronic BP monitoring frequency: not checking BP medication side effects: no Past BP meds: lisinopril Duration of hyperlipidemia: chronic Cholesterol medication side effects: not on anything Cholesterol supplements: fish oil Past cholesterol medications: none Medication compliance: excellent compliance Aspirin: yes Recent stressors: no Recurrent headaches: no Visual changes: no Palpitations: no Dyspnea: no Chest pain: no Lower extremity edema: no Dizzy/lightheaded: no  HYPOTHYROIDISM Thyroid control status:controlled Satisfied with current treatment? yes Medication side effects: no Medication compliance: excellent compliance Recent dose adjustment:no Fatigue: no Cold intolerance: no Heat intolerance: no Weight gain: no Weight loss: no Constipation: no Diarrhea/loose stools: no Palpitations: no Lower extremity edema: no Anxiety/depressed mood: no  Relevant past medical, surgical, family and social history reviewed and updated as indicated. Interim medical history since our last visit reviewed. Allergies and medications reviewed and updated.  Review of Systems  Constitutional: Negative.   Respiratory: Negative.    Cardiovascular: Negative.   Gastrointestinal: Negative.   Musculoskeletal: Negative.   Psychiatric/Behavioral: Negative.     Per HPI unless specifically indicated above     Objective:    BP 138/70   Pulse 65   Temp 98.3 F (36.8 C) (Oral)   Ht 4' 11.6" (1.514 m)   Wt 120 lb  3.2 oz (54.5 kg)   SpO2 100%   BMI 23.79 kg/m   Wt Readings from Last 3 Encounters:  08/13/20 120 lb 3.2 oz (54.5 kg)  02/13/20 116 lb 9.6 oz (52.9 kg)  12/25/19 115 lb (52.2 kg)    Physical Exam Vitals and nursing note reviewed.  Constitutional:      General: She is not in acute distress.    Appearance: Normal appearance. She is not ill-appearing, toxic-appearing or diaphoretic.  HENT:     Head: Normocephalic and atraumatic.     Right Ear: External ear normal.     Left Ear: External ear normal.     Nose: Nose normal.     Mouth/Throat:     Mouth: Mucous membranes are moist.     Pharynx: Oropharynx is clear.  Eyes:     General: No scleral icterus.       Right eye: No discharge.        Left eye: No discharge.     Extraocular Movements: Extraocular movements intact.     Conjunctiva/sclera: Conjunctivae normal.     Pupils: Pupils are equal, round, and reactive to light.  Cardiovascular:     Rate and Rhythm: Normal rate and regular rhythm.     Pulses: Normal pulses.     Heart sounds: Normal heart sounds. No murmur heard.   No friction rub. No gallop.  Pulmonary:     Effort: Pulmonary effort is normal. No respiratory distress.     Breath sounds: Normal breath sounds. No stridor. No wheezing, rhonchi or rales.  Chest:     Chest wall: No tenderness.  Musculoskeletal:  General: Normal range of motion.     Cervical back: Normal range of motion and neck supple.  Skin:    General: Skin is warm and dry.     Capillary Refill: Capillary refill takes less than 2 seconds.     Coloration: Skin is not jaundiced or pale.     Findings: No bruising, erythema, lesion or rash.  Neurological:     General: No focal deficit present.     Mental Status: She is alert and oriented to person, place, and time. Mental status is at baseline.  Psychiatric:        Mood and Affect: Mood normal.        Behavior: Behavior normal.        Thought Content: Thought content normal.        Judgment:  Judgment normal.    Results for orders placed or performed in visit on 05/06/20  TSH  Result Value Ref Range   TSH 3.100 0.450 - 4.500 uIU/mL      Assessment & Plan:   Problem List Items Addressed This Visit       Cardiovascular and Mediastinum   HTN (hypertension) - Primary    Under good control on current regimen. Continue current regimen. Continue to monitor. Call with any concerns. Refills given. Labs drawn today.        Relevant Medications   lisinopril (ZESTRIL) 5 MG tablet   Other Relevant Orders   CBC with Differential/Platelet   Comprehensive metabolic panel     Endocrine   Hypothyroid    Rechecking levels today. Tolerating her meds well. Await results. Treat as needed.        Relevant Orders   CBC with Differential/Platelet   Comprehensive metabolic panel   TSH     Other   Hyperlipidemia    Under good control on current regimen. Continue current regimen. Continue to monitor. Call with any concerns. Refills given. Labs drawn today.        Relevant Medications   lisinopril (ZESTRIL) 5 MG tablet   Other Relevant Orders   CBC with Differential/Platelet   Comprehensive metabolic panel   Lipid Panel w/o Chol/HDL Ratio     Follow up plan: Return in about 6 months (around 02/13/2021) for physical.

## 2020-08-14 ENCOUNTER — Encounter: Payer: Self-pay | Admitting: Family Medicine

## 2020-08-14 LAB — COMPREHENSIVE METABOLIC PANEL
ALT: 7 IU/L (ref 0–32)
AST: 16 IU/L (ref 0–40)
Albumin/Globulin Ratio: 1.6 (ref 1.2–2.2)
Albumin: 4.2 g/dL (ref 3.7–4.7)
Alkaline Phosphatase: 36 IU/L — ABNORMAL LOW (ref 44–121)
BUN/Creatinine Ratio: 17 (ref 12–28)
BUN: 13 mg/dL (ref 8–27)
Bilirubin Total: 0.4 mg/dL (ref 0.0–1.2)
CO2: 25 mmol/L (ref 20–29)
Calcium: 9.3 mg/dL (ref 8.7–10.3)
Chloride: 98 mmol/L (ref 96–106)
Creatinine, Ser: 0.77 mg/dL (ref 0.57–1.00)
Globulin, Total: 2.6 g/dL (ref 1.5–4.5)
Glucose: 71 mg/dL (ref 65–99)
Potassium: 4.7 mmol/L (ref 3.5–5.2)
Sodium: 139 mmol/L (ref 134–144)
Total Protein: 6.8 g/dL (ref 6.0–8.5)
eGFR: 82 mL/min/{1.73_m2} (ref >59)

## 2020-08-14 LAB — CBC WITH DIFFERENTIAL/PLATELET
Basophils Absolute: 0.1 10*3/uL (ref 0.0–0.2)
Basos: 1 %
EOS (ABSOLUTE): 0.1 10*3/uL (ref 0.0–0.4)
Eos: 2 %
Hematocrit: 44.4 % (ref 34.0–46.6)
Hemoglobin: 14.6 g/dL (ref 11.1–15.9)
Immature Grans (Abs): 0 10*3/uL (ref 0.0–0.1)
Immature Granulocytes: 0 %
Lymphocytes Absolute: 1.5 10*3/uL (ref 0.7–3.1)
Lymphs: 21 %
MCH: 30.5 pg (ref 26.6–33.0)
MCHC: 32.9 g/dL (ref 31.5–35.7)
MCV: 93 fL (ref 79–97)
Monocytes Absolute: 0.6 10*3/uL (ref 0.1–0.9)
Monocytes: 8 %
Neutrophils Absolute: 5 10*3/uL (ref 1.4–7.0)
Neutrophils: 68 %
Platelets: 288 10*3/uL (ref 150–450)
RBC: 4.79 x10E6/uL (ref 3.77–5.28)
RDW: 12.8 % (ref 11.7–15.4)
WBC: 7.3 10*3/uL (ref 3.4–10.8)

## 2020-08-14 LAB — LIPID PANEL W/O CHOL/HDL RATIO
Cholesterol, Total: 199 mg/dL (ref 100–199)
HDL: 50 mg/dL (ref >39)
LDL Chol Calc (NIH): 119 mg/dL — ABNORMAL HIGH (ref 0–99)
Triglycerides: 170 mg/dL — ABNORMAL HIGH (ref 0–149)
VLDL Cholesterol Cal: 30 mg/dL (ref 5–40)

## 2020-08-14 LAB — TSH: TSH: 2.85 u[IU]/mL (ref 0.450–4.500)

## 2020-12-18 DIAGNOSIS — H2510 Age-related nuclear cataract, unspecified eye: Secondary | ICD-10-CM | POA: Diagnosis not present

## 2020-12-25 ENCOUNTER — Ambulatory Visit (INDEPENDENT_AMBULATORY_CARE_PROVIDER_SITE_OTHER): Payer: Medicare PPO | Admitting: *Deleted

## 2020-12-25 DIAGNOSIS — Z0000 Encounter for general adult medical examination without abnormal findings: Secondary | ICD-10-CM | POA: Diagnosis not present

## 2020-12-25 NOTE — Progress Notes (Signed)
Subjective:   Chelsea Hughes is a 70 y.o. female who presents for Medicare Annual (Subsequent) preventive examination.  I connected with  Chelsea Hughes on 12/25/20 by a  telephone enabled telemedicine application and verified that I am speaking with the correct person using two identifiers.   I discussed the limitations of evaluation and management by telemedicine. The patient expressed understanding and agreed to proceed.  Patient location: home  Provider location: Tele-health  not in office    Review of Systems     Cardiac Risk Factors include: advanced age (>89men, >25 women);hypertension     Objective:    Today's Vitals   There is no height or weight on file to calculate BMI.  Advanced Directives 12/25/2020 12/25/2019 05/05/2019 12/05/2018 12/02/2017 11/29/2015  Does Patient Have a Medical Advance Directive? No No No No No No  Would patient like information on creating a medical advance directive? No - Patient declined - No - Patient declined - Yes (MAU/Ambulatory/Procedural Areas - Information given) Yes - Educational materials given    Current Medications (verified) Outpatient Encounter Medications as of 12/25/2020  Medication Sig   alendronate (FOSAMAX) 70 MG tablet Take 1 tablet (70 mg total) by mouth every 7 (seven) days. Take with a full glass of water on an empty stomach.   aspirin EC 81 MG tablet Take 81 mg by mouth daily.   Biotin 23536 MCG TABS Take by mouth.   Calcium Carbonate (CALCIUM 600 PO) Take 600 mg by mouth 2 (two) times daily.   Calcium Carbonate-Vitamin D 600-400 MG-UNIT tablet Take by mouth.   Flaxseed, Linseed, (FLAXSEED OIL) 1000 MG CAPS Take 1,000 mg by mouth daily.   fluticasone (FLONASE) 50 MCG/ACT nasal spray Place 2 sprays into both nostrils daily.   levothyroxine (SYNTHROID) 25 MCG tablet Take 1 tablet (25 mcg total) by mouth daily before breakfast.   lisinopril (ZESTRIL) 5 MG tablet Take 1 tablet (5 mg total) by mouth daily.   loratadine  (CLARITIN) 10 MG tablet Take 10 mg by mouth daily.   Multiple Vitamins-Minerals (ONE DAILY CALCIUM/IRON) TABS Take by mouth.   Omega-3 Fatty Acids (FISH OIL) 1200 MG CAPS Take 1,200 mg by mouth daily.   Turmeric POWD Take by mouth.   No facility-administered encounter medications on file as of 12/25/2020.    Allergies (verified) Patient has no known allergies.   History: Past Medical History:  Diagnosis Date   Atypical squamous cell changes of undetermined significance (ASCUS) on cervical cytology with positive high risk human papilloma virus (HPV)    Diverticulosis    on Colonoscopy 2007; CT scan 2014   Microscopic hematuria    evaluated by Urology in 2014, abd/pelvis CT scan Dec 2014   Osteoporosis    Past Surgical History:  Procedure Laterality Date   MANDIBLE SURGERY  04/2019   Family History  Problem Relation Age of Onset   Heart disease Mother        CABG   Hypertension Mother    Osteoporosis Mother    Thyroid disease Mother    Heart disease Father    Hypertension Father    Cancer Father        throat (smoker)   Hypertension Brother    Hypertension Son    COPD Neg Hx    Diabetes Neg Hx    Stroke Neg Hx    Breast cancer Neg Hx    Social History   Socioeconomic History   Marital status: Widowed    Spouse  name: Not on file   Number of children: Not on file   Years of education: Not on file   Highest education level: Not on file  Occupational History   Not on file  Tobacco Use   Smoking status: Never   Smokeless tobacco: Never  Vaping Use   Vaping Use: Never used  Substance and Sexual Activity   Alcohol use: Yes    Alcohol/week: 7.0 standard drinks    Types: 7 Glasses of wine per week    Comment: wine   Drug use: No   Sexual activity: Not Currently  Other Topics Concern   Not on file  Social History Narrative   Not on file   Social Determinants of Health   Financial Resource Strain: Low Risk    Difficulty of Paying Living Expenses: Not hard at  all  Food Insecurity: No Food Insecurity   Worried About Charity fundraiser in the Last Year: Never true   Ran Out of Food in the Last Year: Never true  Transportation Needs: No Transportation Needs   Lack of Transportation (Medical): No   Lack of Transportation (Non-Medical): No  Physical Activity: Sufficiently Active   Days of Exercise per Week: 4 days   Minutes of Exercise per Session: 40 min  Stress: No Stress Concern Present   Feeling of Stress : Not at all  Social Connections: Moderately Integrated   Frequency of Communication with Friends and Family: More than three times a week   Frequency of Social Gatherings with Friends and Family: More than three times a week   Attends Religious Services: More than 4 times per year   Active Member of [employer] or Organizations: Yes   Attends Archivist Meetings: More than 4 times per year   Marital Status: Widowed    Tobacco Counseling Counseling given: Not Answered   Clinical Intake:  Pre-visit preparation completed: Yes  Pain : No/denies pain     Nutritional Risks: None  How often do you need to have someone help you when you read instructions, pamphlets, or other written materials from your doctor or pharmacy?: 1 - Never  Diabetic?  no  Interpreter Needed?: No  Information entered by :: Leroy Kennedy LPN   Activities of Daily Living In your present state of health, do you have any difficulty performing the following activities: 12/25/2020 02/13/2020  Hearing? N N  Vision? N N  Difficulty concentrating or making decisions? N N  Walking or climbing stairs? N N  Dressing or bathing? N N  Doing errands, shopping? N N  Preparing Food and eating ? N -  Using the Toilet? N -  In the past six months, have you accidently leaked urine? N -  Do you have problems with loss of bowel control? N -  Managing your Medications? N -  Managing your Finances? N -  Housekeeping or managing your Housekeeping? N -  Some recent  data might be hidden    Patient Care Team: Valerie Roys, DO as PCP - General (Family Medicine) Christene Slates, MD (Dermatology)  Indicate any recent Medical Services you may have received from other than [facility] providers in the past year (date may be approximate).     Assessment:   This is a routine wellness examination for [place].  Hearing/Vision screen Hearing Screening - Comments:: No trouble hearing Vision Screening - Comments:: Up to date [place]  Dietary issues and exercise activities discussed: Current Exercise Habits: Home exercise routine, Type of  exercise: walking, Time (Minutes): 40, Frequency (Times/Week): 4, Weekly Exercise (Minutes/Week): 160, Exercise limited by: None identified   Goals Addressed             This Visit's Progress    Patient Stated   On track    12/25/2019, eat healthy and continue to exercise     Patient Stated       Continue eating healthy       Depression Screen PHQ 2/9 Scores 12/25/2020 02/13/2020 12/25/2019 12/19/2018 12/05/2018 12/02/2017 11/30/2016  PHQ - 2 Score 0 0 0 0 0 0 0  PHQ- 9 Score - - - - - - 0    Fall Risk Fall Risk  12/25/2020 12/25/2019 12/19/2018 12/05/2018 12/02/2017  Falls in the past year? 0 1 0 0 0  Comment - tripped while gardening - - -  Number falls in past yr: 0 0 0 0 0  Injury with Fall? 0 1 0 0 0  Risk for fall due to : - History of fall(s);Medication side effect - - -  Follow up Falls evaluation completed Falls evaluation completed;Education provided;Falls prevention discussed - - -    FALL RISK PREVENTION PERTAINING TO THE HOME:  Any stairs in or around the home? Yes  If so, are there any without handrails? No  Home free of loose throw rugs in walkways, pet beds, electrical cords, etc? Yes  Adequate lighting in your home to reduce risk of falls? Yes   ASSISTIVE DEVICES UTILIZED TO PREVENT FALLS:  Life alert? No  Use of a cane, walker or w/c? No  Grab bars in the bathroom? Yes  Shower chair  or bench in shower? No  Elevated toilet seat or a handicapped toilet? Yes   TIMED UP AND GO:  Was the test performed? No .    Cognitive Function:  Normal cognitive status assessed by direct observation by this Nurse Health Advisor. No abnormalities found.       6CIT Screen 12/25/2019 12/02/2017 11/30/2016 11/29/2015  What Year? 0 points 0 points 0 points 0 points  What month? 0 points 0 points 0 points 0 points  What time? 0 points 0 points 0 points 0 points  Count back from 20 0 points 0 points 0 points 0 points  Months in reverse 2 points 0 points - 0 points  Repeat phrase 0 points 0 points 0 points 4 points  Total Score 2 0 - 4    Immunizations Immunization History  Administered Date(s) Administered   Fluad Quad(high Dose 65+) 10/02/2020   Influenza, High Dose Seasonal PF 11/06/2017, 09/19/2018, 10/20/2019   Influenza-Unspecified 09/20/2014, 10/16/2015, 11/09/2016   PFIZER Comirnaty(Gray Top)Covid-19 Tri-Sucrose Vaccine 04/30/2020   PFIZER(Purple Top)SARS-COV-2 Vaccination 02/28/2019, 03/21/2019, 11/02/2019   Pneumococcal Conjugate-13 01/01/2014   Pneumococcal Polysaccharide-23 11/29/2015   Td 08/07/2004   Tdap 09/29/2010   Zoster Recombinat (Shingrix) 11/12/2018   Zoster, Live 02/20/2010    TDAP status: Due, Education has been provided regarding the importance of this vaccine. Advised may receive this vaccine at local pharmacy or Health Dept. Aware to provide a copy of the vaccination record if obtained from local pharmacy or Health Dept. Verbalized acceptance and understanding.  Flu Vaccine status: Up to date  Pneumococcal vaccine status: Up to date  Covid-19 vaccine status: Completed vaccines  Qualifies for Shingles Vaccine? Yes   Zostavax completed Yes   Shingrix Completed?: No.    Education has been provided regarding the importance of this vaccine. Patient has been advised to call insurance company  to determine out of pocket expense if they have not yet  received this vaccine. Advised may also receive vaccine at local pharmacy or Health Dept. Verbalized acceptance and understanding.  Screening Tests Health Maintenance  Topic Date Due   Zoster Vaccines- Shingrix (2 of 2) 01/07/2019   TETANUS/TDAP  09/28/2020   Fecal DNA (Cologuard)  01/06/2021   MAMMOGRAM  03/04/2022   DEXA SCAN  03/05/2023   Pneumonia Vaccine 21+ Years old  Completed   INFLUENZA VACCINE  Completed   COVID-19 Vaccine  Completed   Hepatitis C Screening  Completed   HPV VACCINES  Aged Out    Health Maintenance  Health Maintenance Due  Topic Date Due   Zoster Vaccines- Shingrix (2 of 2) 01/07/2019   TETANUS/TDAP  09/28/2020    Colorectal cancer screening: Type of screening: Cologuard. Completed 2019. Repeat every 3 years  Mammogram status: Completed  . Repeat every year  Bone Density  osteoporosis  Lung Cancer Screening: (Low Dose CT Chest recommended if Age 60-80 years, 30 pack-year currently smoking OR have quit w/in 15years.) does not qualify.   Lung Cancer Screening Referral:   Additional Screening:  Hepatitis C Screening: does not qualify; Completed 2017  Vision Screening: Recommended annual ophthalmology exams for early detection of glaucoma and other disorders of the eye. Is the patient up to date with their annual eye exam?  Yes  Who is the provider or what is the name of the office in which the patient attends annual eye exams? Dr. Ellin Mayhew If pt is not established with a provider, would they like to be referred to a provider to establish care? No .   Dental Screening: Recommended annual dental exams for proper oral hygiene  Community Resource Referral / Chronic Care Management: CRR required this visit?  No   CCM required this visit?  No      Plan:     I have personally reviewed and noted the following in the patient's chart:   Medical and social history Use of alcohol, tobacco or illicit drugs  Current medications and supplements  including opioid prescriptions.  Functional ability and status Nutritional status Physical activity Advanced directives List of other physicians Hospitalizations, surgeries, and ER visits in previous 12 months Vitals Screenings to include cognitive, depression, and falls Referrals and appointments  In addition, I have reviewed and discussed with patient certain preventive protocols, quality metrics, and best practice recommendations. A written personalized care plan for preventive services as well as general preventive health recommendations were provided to patient.     Leroy Kennedy, LPN   [id number]   Nurse Notes:

## 2020-12-25 NOTE — Patient Instructions (Signed)
Ms. Chelsea Hughes , Thank you for taking time to come for your Medicare Wellness Visit. I appreciate your ongoing commitment to your health goals. Please review the following plan we discussed and let me know if I can assist you in the future.   Screening recommendations/referrals: Colonoscopy: up to date.  Due 12-22  Mammogram: up to date Bone Density: up to date Recommended yearly ophthalmology/optometry visit for glaucoma screening and checkup Recommended yearly dental visit for hygiene and checkup  Vaccinations: Influenza vaccine: up to date Pneumococcal vaccine: up to date Tdap vaccine: Education provided Shingles vaccine: 1 of 2 shingrix completed    Advanced directives: Education provided  Conditions/risks identified:   Next appointment: 02-17-2021 @ 72 8:00 [facility] 65 Years and Older, Female Preventive care refers to lifestyle choices and visits with your health care provider that can promote health and wellness. What does preventive care include? A yearly physical exam. This is also called an annual well check. Dental exams once or twice a year. Routine eye exams. Ask your health care provider how often you should have your eyes checked. Personal lifestyle choices, including: Daily care of your teeth and gums. Regular physical activity. Eating a healthy diet. Avoiding tobacco and drug use. Limiting alcohol use. Practicing safe sex. Taking low-dose aspirin every day. Taking vitamin and mineral supplements as recommended by your health care provider. What happens during an annual well check? The services and screenings done by your health care provider during your annual well check will depend on your age, overall health, lifestyle risk factors, and family history of disease. Counseling  Your health care provider may ask you questions about your: Alcohol use. Tobacco use. Drug use. Emotional well-being. Home and relationship well-being. Sexual  activity. Eating habits. History of falls. Memory and ability to understand (cognition). Work and work Astronomer. Reproductive health. Screening  You may have the following tests or measurements: Height, weight, and BMI. Blood pressure. Lipid and cholesterol levels. These may be checked every 5 years, or more frequently if you are over 41 years old. Skin check. Lung cancer screening. You may have this screening every year starting at age 72 if you have a 30-pack-year history of smoking and currently smoke or have quit within the past 15 years. Fecal occult blood test (FOBT) of the stool. You may have this test every year starting at age 72. Flexible sigmoidoscopy or colonoscopy. You may have a sigmoidoscopy every 5 years or a colonoscopy every 10 years starting at age 72. Hepatitis C blood test. Hepatitis B blood test. Sexually transmitted disease (STD) testing. Diabetes screening. This is done by checking your blood sugar (glucose) after you have not eaten for a while (fasting). You may have this done every 1-3 years. Bone density scan. This is done to screen for osteoporosis. You may have this done starting at age 72. Mammogram. This may be done every 1-2 years. Talk to your health care provider about how often you should have regular mammograms. Talk with your health care provider about your test results, treatment options, and if necessary, the need for more tests. Vaccines  Your health care provider may recommend certain vaccines, such as: Influenza vaccine. This is recommended every year. Tetanus, diphtheria, and acellular pertussis (Tdap, Td) vaccine. You may need a Td booster every 10 years. Zoster vaccine. You may need this after age 20. Pneumococcal 13-valent conjugate (PCV13) vaccine. One dose is recommended after age 80. Pneumococcal polysaccharide (PPSV23) vaccine. One dose is recommended after age 50.  Talk to your health care provider about which screenings and vaccines  you need and how often you need them. This information is not intended to replace advice given to you by your health care provider. Make sure you discuss any questions you have with your health care provider. Document Released: 02/01/2015 Document Revised: 09/25/2015 Document Reviewed: 11/06/2014 Elsevier Interactive Patient Education  2017 [place] Prevention in the Home Falls can cause injuries. They can happen to people of all ages. There are many things you can do to make your home safe and to help prevent falls. What can I do on the outside of my home? Regularly fix the edges of walkways and driveways and fix any cracks. Remove anything that might make you trip as you walk through a door, such as a raised step or threshold. Trim any bushes or trees on the path to your home. Use bright outdoor lighting. Clear any walking paths of anything that might make someone trip, such as rocks or tools. Regularly check to see if handrails are loose or broken. Make sure that both sides of any steps have handrails. Any raised decks and porches should have guardrails on the edges. Have any leaves, snow, or ice cleared regularly. Use sand or salt on walking paths during winter. Clean up any spills in your garage right away. This includes oil or grease spills. What can I do in the bathroom? Use night lights. Install grab bars by the toilet and in the tub and shower. Do not use towel bars as grab bars. Use non-skid mats or decals in the tub or shower. If you need to sit down in the shower, use a plastic, non-slip stool. Keep the floor dry. Clean up any water that spills on the floor as soon as it happens. Remove soap buildup in the tub or shower regularly. Attach bath mats securely with double-sided non-slip rug tape. Do not have throw rugs and other things on the floor that can make you trip. What can I do in the bedroom? Use night lights. Make sure that you have a light by your bed that  is easy to reach. Do not use any sheets or blankets that are too big for your bed. They should not hang down onto the floor. Have a firm chair that has side arms. You can use this for support while you get dressed. Do not have throw rugs and other things on the floor that can make you trip. What can I do in the kitchen? Clean up any spills right away. Avoid walking on wet floors. Keep items that you use a lot in easy-to-reach places. If you need to reach something above you, use a strong step stool that has a grab bar. Keep electrical cords out of the way. Do not use floor polish or wax that makes floors slippery. If you must use wax, use non-skid floor wax. Do not have throw rugs and other things on the floor that can make you trip. What can I do with my stairs? Do not leave any items on the stairs. Make sure that there are handrails on both sides of the stairs and use them. Fix handrails that are broken or loose. Make sure that handrails are as long as the stairways. Check any carpeting to make sure that it is firmly attached to the stairs. Fix any carpet that is loose or worn. Avoid having throw rugs at the top or bottom of the stairs. If you do have throw  rugs, attach them to the floor with carpet tape. Make sure that you have a light switch at the top of the stairs and the bottom of the stairs. If you do not have them, ask someone to add them for you. What else can I do to help prevent falls? Wear shoes that: Do not have high heels. Have rubber bottoms. Are comfortable and fit you well. Are closed at the toe. Do not wear sandals. If you use a stepladder: Make sure that it is fully opened. Do not climb a closed stepladder. Make sure that both sides of the stepladder are locked into place. Ask someone to hold it for you, if possible. Clearly mark and make sure that you can see: Any grab bars or handrails. First and last steps. Where the edge of each step is. Use tools that help you  move around (mobility aids) if they are needed. These include: Canes. Walkers. Scooters. Crutches. Turn on the lights when you go into a dark area. Replace any light bulbs as soon as they burn out. Set up your furniture so you have a clear path. Avoid moving your furniture around. If any of your floors are uneven, fix them. If there are any pets around you, be aware of where they are. Review your medicines with your doctor. Some medicines can make you feel dizzy. This can increase your chance of falling. Ask your doctor what other things that you can do to help prevent falls. This information is not intended to replace advice given to you by your health care provider. Make sure you discuss any questions you have with your health care provider. Document Released: 11/01/2008 Document Revised: 06/13/2015 Document Reviewed: 02/09/2014 Elsevier Interactive Patient Education  2017 [employer].

## 2021-02-11 ENCOUNTER — Other Ambulatory Visit: Payer: Self-pay | Admitting: Family Medicine

## 2021-02-11 NOTE — Telephone Encounter (Signed)
Requested medications are due for refill today.  yes  Requested medications are on the active medications list.  yes  Last refill. 03/21/2020  Future visit scheduled.   yes  Notes to clinic.  Failed protocol d/t expired labs.    Requested Prescriptions  Pending Prescriptions Disp Refills   alendronate (FOSAMAX) 70 MG tablet [Pharmacy Med Name: ALENDRONATE SODIUM 70 MG TAB] 4 tablet 11    Sig: Take 1 tablet (70 mg total) by mouth every 7 (seven) days. Take with a full glass of water on an empty stomach.     Endocrinology:  Bisphosphonates Failed - 02/11/2021  1:31 AM      Failed - Vitamin D in normal range and within 360 days    Vit D, 25-Hydroxy  Date Value Ref Range Status  12/13/2017 37.3 30.0 - 100.0 ng/mL Final    Comment:    Vitamin D deficiency has been defined by the [place] practice guideline as a level of serum 25-OH vitamin D less than 20 ng/mL (1,2). The Endocrine Society went on to further define vitamin D insufficiency as a level between 21 and 29 ng/mL (2). 1. IOM (Institute of Medicine). 2010. Dietary reference    intakes for calcium and D. [place]: The    [employer]. 2. Holick MF, Binkley Quemado, Bischoff-Ferrari HA, et al.    Evaluation, treatment, and prevention of vitamin D    deficiency: an Endocrine Society clinical practice    guideline. JCEM. 2011 Jul; 96(7):1911-30.           Passed - Ca in normal range and within 360 days    Calcium  Date Value Ref Range Status  08/13/2020 9.3 8.7 - 10.3 mg/dL Final          Passed - Valid encounter within last 12 months    Recent Outpatient Visits           6 months ago Primary hypertension   [facility], Megan P, DO   12 months ago Routine general medical examination at a health care facility   [place], [place], DO   1 year ago Essential hypertension   [facility], [place], DO   1  year ago Mixed hyperlipidemia   [place], DO   1 year ago Neck pain   [facility] Eulogio Bear, NP       Future Appointments             In 6 days Wynetta Emery, Barb Merino, DO [facility]

## 2021-02-17 ENCOUNTER — Encounter: Payer: Self-pay | Admitting: Family Medicine

## 2021-02-17 ENCOUNTER — Ambulatory Visit (INDEPENDENT_AMBULATORY_CARE_PROVIDER_SITE_OTHER): Payer: Medicare PPO | Admitting: Family Medicine

## 2021-02-17 ENCOUNTER — Other Ambulatory Visit: Payer: Self-pay

## 2021-02-17 VITALS — BP 131/77 | HR 67 | Temp 97.8°F | Ht 60.0 in | Wt 122.8 lb

## 2021-02-17 DIAGNOSIS — S81802A Unspecified open wound, left lower leg, initial encounter: Secondary | ICD-10-CM

## 2021-02-17 DIAGNOSIS — E782 Mixed hyperlipidemia: Secondary | ICD-10-CM | POA: Diagnosis not present

## 2021-02-17 DIAGNOSIS — Z1231 Encounter for screening mammogram for malignant neoplasm of breast: Secondary | ICD-10-CM | POA: Diagnosis not present

## 2021-02-17 DIAGNOSIS — Z23 Encounter for immunization: Secondary | ICD-10-CM | POA: Diagnosis not present

## 2021-02-17 DIAGNOSIS — E039 Hypothyroidism, unspecified: Secondary | ICD-10-CM

## 2021-02-17 DIAGNOSIS — Z0000 Encounter for general adult medical examination without abnormal findings: Secondary | ICD-10-CM

## 2021-02-17 DIAGNOSIS — Z1211 Encounter for screening for malignant neoplasm of colon: Secondary | ICD-10-CM

## 2021-02-17 DIAGNOSIS — I10 Essential (primary) hypertension: Secondary | ICD-10-CM

## 2021-02-17 LAB — URINALYSIS, ROUTINE W REFLEX MICROSCOPIC
Bilirubin, UA: NEGATIVE
Glucose, UA: NEGATIVE
Ketones, UA: NEGATIVE
Nitrite, UA: NEGATIVE
Protein,UA: NEGATIVE
Specific Gravity, UA: 1.015 (ref 1.005–1.030)
Urobilinogen, Ur: 0.2 mg/dL (ref 0.2–1.0)
pH, UA: 7 (ref 5.0–7.5)

## 2021-02-17 LAB — MICROALBUMIN, URINE WAIVED
Creatinine, Urine Waived: 200 mg/dL (ref 10–300)
Microalb, Ur Waived: 30 mg/L — ABNORMAL HIGH (ref 0–19)
Microalb/Creat Ratio: <30 mg/g (ref <30)

## 2021-02-17 LAB — MICROSCOPIC EXAMINATION: Bacteria, UA: NONE SEEN

## 2021-02-17 MED ORDER — ALENDRONATE SODIUM 70 MG PO TABS: 70.0000 mg | ORAL_TABLET | ORAL | 11 refills | Status: DC

## 2021-02-17 MED ORDER — LISINOPRIL 5 MG PO TABS
5.0000 mg | ORAL_TABLET | Freq: Every day | ORAL | 1 refills | Status: DC
Start: 2021-02-17 — End: 2021-08-18

## 2021-02-17 NOTE — Progress Notes (Signed)
BP 131/77    Pulse 67    Temp 97.8 F (36.6 C)    Ht 5' (1.524 m)    Wt 122 lb 12.8 oz (55.7 kg)    SpO2 97%    BMI 23.98 kg/m    Subjective:    Patient ID: Chelsea Hughes, female    DOB: 1950/10/01, 71 y.o.   MRN: [id number]  HPI: Chelsea Hughes is a 71 y.o. female presenting on 02/17/2021 for comprehensive medical examination. Current medical complaints include:  HYPERTENSION / HYPERLIPIDEMIA Satisfied with current treatment? yes Duration of hypertension: chronic BP monitoring frequency: not checking BP medication side effects: no Past BP meds: lisinopril Duration of hyperlipidemia: chronic Cholesterol medication side effects: not on anything Cholesterol supplements: fish oil Past cholesterol medications: none Medication compliance: excellent compliance Aspirin: yes Recent stressors: no Recurrent headaches: no Visual changes: no Palpitations: no Dyspnea: no Chest pain: no Lower extremity edema: no Dizzy/lightheaded: no  HYPOTHYROIDISM Thyroid control status:controlled Satisfied with current treatment? yes Medication side effects: no Medication compliance: excellent compliance Recent dose adjustment:no Fatigue: no Cold intolerance: no Heat intolerance: no Weight gain: no Weight loss: no Constipation: no Diarrhea/loose stools: no Palpitations: no Lower extremity edema: no Anxiety/depressed mood: no  Menopausal Symptoms: no  Depression Screen done today and results listed below:  Depression screen [facility] 2/9 02/17/2021 12/25/2020 02/13/2020 12/25/2019 12/19/2018  Decreased Interest 0 0 0 0 0  Down, Depressed, Hopeless 0 0 0 0 0  PHQ - 2 Score 0 0 0 0 0  Altered sleeping 0 - - - -  Tired, decreased energy 0 - - - -  Change in appetite 0 - - - -  Feeling bad or failure about yourself  0 - - - -  Trouble concentrating 0 - - - -  Moving slowly or fidgety/restless 0 - - - -  Suicidal thoughts 0 - - - -  PHQ-9 Score 0 - - - -  Difficult doing work/chores - - - - -      Past Medical History:  Past Medical History:  Diagnosis Date   Atypical squamous cell changes of undetermined significance (ASCUS) on cervical cytology with positive high risk human papilloma virus (HPV)    Diverticulosis    on Colonoscopy 2007; CT scan 2014   Microscopic hematuria    evaluated by Urology in 2014, abd/pelvis CT scan Dec 2014   Osteoporosis     Surgical History:  Past Surgical History:  Procedure Laterality Date   MANDIBLE SURGERY  04/2019    Medications:  Current Outpatient Medications on File Prior to Visit  Medication Sig   aspirin EC 81 MG tablet Take 81 mg by mouth daily.   Biotin 10000 MCG TABS Take by mouth.   Calcium Carbonate (CALCIUM 600 PO) Take 600 mg by mouth 2 (two) times daily.   Calcium Carbonate-Vitamin D 600-400 MG-UNIT tablet Take by mouth.   Flaxseed, Linseed, (FLAXSEED OIL) 1000 MG CAPS Take 1,000 mg by mouth daily.   levothyroxine (SYNTHROID) 25 MCG tablet Take 1 tablet (25 mcg total) by mouth daily before breakfast.   loratadine (CLARITIN) 10 MG tablet Take 10 mg by mouth daily.   Multiple Vitamins-Minerals (ONE DAILY CALCIUM/IRON) TABS Take by mouth.   Omega-3 Fatty Acids (FISH OIL) 1200 MG CAPS Take 1,200 mg by mouth daily.   Turmeric POWD Take by mouth.   No current facility-administered medications on file prior to visit.    Allergies:  Not on File  Social History:  Social History   Socioeconomic History   Marital status: Widowed    Spouse name: Not on file   Number of children: Not on file   Years of education: Not on file   Highest education level: Not on file  Occupational History   Not on file  Tobacco Use   Smoking status: Never   Smokeless tobacco: Never  Vaping Use   Vaping Use: Never used  Substance and Sexual Activity   Alcohol use: Yes    Alcohol/week: 7.0 standard drinks    Types: 7 Glasses of wine per week    Comment: wine   Drug use: No   Sexual activity: Not Currently  Other Topics Concern    Not on file  Social History Narrative   Not on file   Social Determinants of Health   Financial Resource Strain: Low Risk    Difficulty of Paying Living Expenses: Not hard at all  Food Insecurity: No Food Insecurity   Worried About Charity fundraiser in the Last Year: Never true   Ran Out of Food in the Last Year: Never true  Transportation Needs: No Transportation Needs   Lack of Transportation (Medical): No   Lack of Transportation (Non-Medical): No  Physical Activity: Sufficiently Active   Days of Exercise per Week: 4 days   Minutes of Exercise per Session: 40 min  Stress: No Stress Concern Present   Feeling of Stress : Not at all  Social Connections: Moderately Integrated   Frequency of Communication with Friends and Family: More than three times a week   Frequency of Social Gatherings with Friends and Family: More than three times a week   Attends Religious Services: More than 4 times per year   Active Member of [employer] or Organizations: Yes   Attends Archivist Meetings: More than 4 times per year   Marital Status: Widowed  Human resources officer Violence: Not At Risk   Fear of Current or Ex-Partner: No   Emotionally Abused: No   Physically Abused: No   Sexually Abused: No   Social History   Tobacco Use  Smoking Status Never  Smokeless Tobacco Never   Social History   Substance and Sexual Activity  Alcohol Use Yes   Alcohol/week: 7.0 standard drinks   Types: 7 Glasses of wine per week   Comment: wine    Family History:  Family History  Problem Relation Age of Onset   Heart disease Mother        CABG   Hypertension Mother    Osteoporosis Mother    Thyroid disease Mother    Heart disease Father    Hypertension Father    Cancer Father        throat (smoker)   Hypertension Brother    Hypertension Son    COPD Neg Hx    Diabetes Neg Hx    Stroke Neg Hx    Breast cancer Neg Hx     Past medical history, surgical history, medications, allergies,  family history and social history reviewed with patient today and changes made to appropriate areas of the chart.   Review of Systems  Constitutional: Negative.   HENT: Negative.    Eyes: Negative.   Respiratory: Negative.    Cardiovascular: Negative.   Gastrointestinal: Negative.   Genitourinary: Negative.   Musculoskeletal: Negative.   Skin: Negative.   Neurological: Negative.   Endo/Heme/Allergies: Negative.   Psychiatric/Behavioral: Negative.    All other ROS negative except what is listed above  and in the HPI.      Objective:    BP 131/77    Pulse 67    Temp 97.8 F (36.6 C)    Ht 5' (1.524 m)    Wt 122 lb 12.8 oz (55.7 kg)    SpO2 97%    BMI 23.98 kg/m   Wt Readings from Last 3 Encounters:  02/17/21 122 lb 12.8 oz (55.7 kg)  08/13/20 120 lb 3.2 oz (54.5 kg)  02/13/20 116 lb 9.6 oz (52.9 kg)    Physical Exam Vitals and nursing note reviewed.  Constitutional:      General: She is not in acute distress.    Appearance: Normal appearance. She is not ill-appearing, toxic-appearing or diaphoretic.  HENT:     Head: Normocephalic and atraumatic.     Right Ear: Tympanic membrane, ear canal and external ear normal. There is no impacted cerumen.     Left Ear: Tympanic membrane, ear canal and external ear normal. There is no impacted cerumen.     Nose: Nose normal. No congestion or rhinorrhea.     Mouth/Throat:     Mouth: Mucous membranes are moist.     Pharynx: Oropharynx is clear. No oropharyngeal exudate or posterior oropharyngeal erythema.  Eyes:     General: No scleral icterus.       Right eye: No discharge.        Left eye: No discharge.     Extraocular Movements: Extraocular movements intact.     Conjunctiva/sclera: Conjunctivae normal.     Pupils: Pupils are equal, round, and reactive to light.  Neck:     Vascular: No carotid bruit.  Cardiovascular:     Rate and Rhythm: Normal rate and regular rhythm.     Pulses: Normal pulses.     Heart sounds: No murmur  heard.   No friction rub. No gallop.  Pulmonary:     Effort: Pulmonary effort is normal. No respiratory distress.     Breath sounds: Normal breath sounds. No stridor. No wheezing, rhonchi or rales.  Chest:     Chest wall: No tenderness.  Abdominal:     General: Abdomen is flat. Bowel sounds are normal. There is no distension.     Palpations: Abdomen is soft. There is no mass.     Tenderness: There is no abdominal tenderness. There is no right CVA tenderness, left CVA tenderness, guarding or rebound.     Hernia: No hernia is present.  Genitourinary:    Comments: Breast and pelvic exams deferred with shared decision making Musculoskeletal:        General: No swelling, tenderness, deformity or signs of injury.     Cervical back: Normal range of motion and neck supple. No rigidity. No muscular tenderness.     Right lower leg: No edema.     Left lower leg: No edema.  Lymphadenopathy:     Cervical: No cervical adenopathy.  Skin:    General: Skin is warm and dry.     Capillary Refill: Capillary refill takes less than 2 seconds.     Coloration: Skin is not jaundiced or pale.     Findings: No bruising, erythema, lesion or rash.  Neurological:     General: No focal deficit present.     Mental Status: She is alert and oriented to person, place, and time. Mental status is at baseline.     Cranial Nerves: No cranial nerve deficit.     Sensory: No sensory deficit.  Motor: No weakness.     Coordination: Coordination normal.     Gait: Gait normal.     Deep Tendon Reflexes: Reflexes normal.  Psychiatric:        Mood and Affect: Mood normal.        Behavior: Behavior normal.        Thought Content: Thought content normal.        Judgment: Judgment normal.    Results for orders placed or performed in visit on 08/13/20  CBC with Differential/Platelet  Result Value Ref Range   WBC 7.3 3.4 - 10.8 x10E3/uL   RBC 4.79 3.77 - 5.28 x10E6/uL   Hemoglobin 14.6 11.1 - 15.9 g/dL   Hematocrit  44.4 34.0 - 46.6 %   MCV 93 79 - 97 fL   MCH 30.5 26.6 - 33.0 pg   MCHC 32.9 31.5 - 35.7 g/dL   RDW 12.8 11.7 - 15.4 %   Platelets 288 150 - 450 x10E3/uL   Neutrophils 68 Not Estab. %   Lymphs 21 Not Estab. %   Monocytes 8 Not Estab. %   Eos 2 Not Estab. %   Basos 1 Not Estab. %   Neutrophils Absolute 5.0 1.4 - 7.0 x10E3/uL   Lymphocytes Absolute 1.5 0.7 - 3.1 x10E3/uL   Monocytes Absolute 0.6 0.1 - 0.9 x10E3/uL   EOS (ABSOLUTE) 0.1 0.0 - 0.4 x10E3/uL   Basophils Absolute 0.1 0.0 - 0.2 x10E3/uL   Immature Granulocytes 0 Not Estab. %   Immature Grans (Abs) 0.0 0.0 - 0.1 x10E3/uL  Comprehensive metabolic panel  Result Value Ref Range   Glucose 71 65 - 99 mg/dL   BUN 13 8 - 27 mg/dL   Creatinine, Ser 0.77 0.57 - 1.00 mg/dL   eGFR 82 >59 mL/min/1.73   BUN/Creatinine Ratio 17 12 - 28   Sodium 139 134 - 144 mmol/L   Potassium 4.7 3.5 - 5.2 mmol/L   Chloride 98 96 - 106 mmol/L   CO2 25 20 - 29 mmol/L   Calcium 9.3 8.7 - 10.3 mg/dL   Total Protein 6.8 6.0 - 8.5 g/dL   Albumin 4.2 3.7 - 4.7 g/dL   Globulin, Total 2.6 1.5 - 4.5 g/dL   Albumin/Globulin Ratio 1.6 1.2 - 2.2   Bilirubin Total 0.4 0.0 - 1.2 mg/dL   Alkaline Phosphatase 36 (L) 44 - 121 IU/L   AST 16 0 - 40 IU/L   ALT 7 0 - 32 IU/L  Lipid Panel w/o Chol/HDL Ratio  Result Value Ref Range   Cholesterol, Total 199 100 - 199 mg/dL   Triglycerides 170 (H) 0 - 149 mg/dL   HDL 50 >39 mg/dL   VLDL Cholesterol Cal 30 5 - 40 mg/dL   LDL Chol Calc (NIH) 119 (H) 0 - 99 mg/dL  TSH  Result Value Ref Range   TSH 2.850 0.450 - 4.500 uIU/mL      Assessment & Plan:   Problem List Items Addressed This Visit       Cardiovascular and Mediastinum   HTN (hypertension)    Under good control on current regimen. Continue current regimen. Continue to monitor. Call with any concerns. Refills given. Labs drawn today.       Relevant Medications   lisinopril (ZESTRIL) 5 MG tablet   Other Relevant Orders   CBC with  Differential/Platelet   Comprehensive metabolic panel   Urinalysis, Routine w reflex microscopic   Microalbumin, Urine Waived     Endocrine   Hypothyroid  Rechecking labs today. Await results. Treat as needed.       Relevant Orders   CBC with Differential/Platelet   Comprehensive metabolic panel   TSH     Other   Hyperlipidemia    Rechecking labs today. Await results. Treat as needed.       Relevant Medications   lisinopril (ZESTRIL) 5 MG tablet   Other Relevant Orders   CBC with Differential/Platelet   Comprehensive metabolic panel   Lipid Panel w/o Chol/HDL Ratio   Other Visit Diagnoses     Routine general medical examination at a health care facility    -  Primary   Vaccines up to date. Screening labs checked today. Mammo and Cologuard ordered. DEXA up to date. Continue diet and exercise. Call with any concerns.   Open wound of left lower leg, initial encounter       Healing well. Due for Td. Given today.   Encounter for screening mammogram for malignant neoplasm of breast       Mammogram ordered today.   Relevant Orders   MM 3D SCREEN BREAST BILATERAL   Screening for colon cancer       Cologuard ordered today.   Relevant Orders   Cologuard        Follow up plan: Return in about 6 months (around 08/17/2021).   LABORATORY TESTING:  - Pap smear: not applicable  IMMUNIZATIONS:   - Tdap: Tetanus vaccination status reviewed: Td vaccination indicated and given today. - Influenza: Up to date - Pneumovax: Up to date - Prevnar: Up to date - COVID: Up to date - HPV: Not applicable - Shingrix vaccine: Up to date  SCREENING: -Mammogram: Ordered today  - Colonoscopy: Ordered today  - Bone Density: Up to date   PATIENT COUNSELING:   Advised to take 1 mg of folate supplement per day if capable of pregnancy.   Sexuality: Discussed sexually transmitted diseases, partner selection, use of condoms, avoidance of unintended pregnancy  and contraceptive  alternatives.   Advised to avoid cigarette smoking.  I discussed with the patient that most people either abstain from alcohol or drink within safe limits (<=14/week and <=4 drinks/occasion for males, <=7/weeks and <= 3 drinks/occasion for females) and that the risk for alcohol disorders and other health effects rises proportionally with the number of drinks per week and how often a drinker exceeds daily limits.  Discussed cessation/primary prevention of drug use and availability of treatment for abuse.   Diet: Encouraged to adjust caloric intake to maintain  or achieve ideal body weight, to reduce intake of dietary saturated fat and total fat, to limit sodium intake by avoiding high sodium foods and not adding table salt, and to maintain adequate dietary potassium and calcium preferably from fresh fruits, vegetables, and low-fat dairy products.    stressed the importance of regular exercise  Injury prevention: Discussed safety belts, safety helmets, smoke detector, smoking near bedding or upholstery.   Dental health: Discussed importance of regular tooth brushing, flossing, and dental visits.    NEXT PREVENTATIVE PHYSICAL DUE IN 1 YEAR. Return in about 6 months (around 08/17/2021).

## 2021-02-17 NOTE — Assessment & Plan Note (Signed)
Under good control on current regimen. Continue current regimen. Continue to monitor. Call with any concerns. Refills given. Labs drawn today.   

## 2021-02-17 NOTE — Assessment & Plan Note (Signed)
Rechecking labs today. Await results. Treat as needed.  °

## 2021-02-17 NOTE — Patient Instructions (Signed)
Please call to schedule your mammogram and/or bone density: °[facility]  °Address: [address], [place], [place] [address]  °Phone: [phone] ° °

## 2021-02-18 LAB — CBC WITH DIFFERENTIAL/PLATELET
Basophils Absolute: 0.1 10*3/uL (ref 0.0–0.2)
Basos: 1 %
EOS (ABSOLUTE): 0.2 10*3/uL (ref 0.0–0.4)
Eos: 4 %
Hematocrit: 44.1 % (ref 34.0–46.6)
Hemoglobin: 14.9 g/dL (ref 11.1–15.9)
Immature Grans (Abs): 0 10*3/uL (ref 0.0–0.1)
Immature Granulocytes: 0 %
Lymphocytes Absolute: 1.8 10*3/uL (ref 0.7–3.1)
Lymphs: 30 %
MCH: 30.8 pg (ref 26.6–33.0)
MCHC: 33.8 g/dL (ref 31.5–35.7)
MCV: 91 fL (ref 79–97)
Monocytes Absolute: 0.5 10*3/uL (ref 0.1–0.9)
Monocytes: 8 %
Neutrophils Absolute: 3.5 10*3/uL (ref 1.4–7.0)
Neutrophils: 57 %
Platelets: 304 10*3/uL (ref 150–450)
RBC: 4.83 x10E6/uL (ref 3.77–5.28)
RDW: 12.7 % (ref 11.7–15.4)
WBC: 6 10*3/uL (ref 3.4–10.8)

## 2021-02-18 LAB — TSH: TSH: 3.94 u[IU]/mL (ref 0.450–4.500)

## 2021-02-18 LAB — COMPREHENSIVE METABOLIC PANEL
ALT: 12 IU/L (ref 0–32)
AST: 18 IU/L (ref 0–40)
Albumin/Globulin Ratio: 1.8 (ref 1.2–2.2)
Albumin: 4.4 g/dL (ref 3.7–4.7)
Alkaline Phosphatase: 35 IU/L — ABNORMAL LOW (ref 44–121)
BUN/Creatinine Ratio: 13 (ref 12–28)
BUN: 11 mg/dL (ref 8–27)
Bilirubin Total: 0.5 mg/dL (ref 0.0–1.2)
CO2: 26 mmol/L (ref 20–29)
Calcium: 9.2 mg/dL (ref 8.7–10.3)
Chloride: 102 mmol/L (ref 96–106)
Creatinine, Ser: 0.84 mg/dL (ref 0.57–1.00)
Globulin, Total: 2.4 g/dL (ref 1.5–4.5)
Glucose: 91 mg/dL (ref 70–99)
Potassium: 4.8 mmol/L (ref 3.5–5.2)
Sodium: 139 mmol/L (ref 134–144)
Total Protein: 6.8 g/dL (ref 6.0–8.5)
eGFR: 74 mL/min/{1.73_m2} (ref >59)

## 2021-02-18 LAB — LIPID PANEL W/O CHOL/HDL RATIO
Cholesterol, Total: 232 mg/dL — ABNORMAL HIGH (ref 100–199)
HDL: 58 mg/dL (ref >39)
LDL Chol Calc (NIH): 150 mg/dL — ABNORMAL HIGH (ref 0–99)
Triglycerides: 133 mg/dL (ref 0–149)
VLDL Cholesterol Cal: 24 mg/dL (ref 5–40)

## 2021-02-21 ENCOUNTER — Encounter: Payer: Self-pay | Admitting: Family Medicine

## 2021-02-28 DIAGNOSIS — Z1211 Encounter for screening for malignant neoplasm of colon: Secondary | ICD-10-CM | POA: Diagnosis not present

## 2021-03-08 LAB — COLOGUARD: COLOGUARD: NEGATIVE

## 2021-03-24 ENCOUNTER — Ambulatory Visit
Admission: RE | Admit: 2021-03-24 | Discharge: 2021-03-24 | Disposition: A | Discharge status: Home / Self Care | Payer: Medicare PPO | Source: Ambulatory Visit | Attending: Family Medicine | Admitting: Family Medicine

## 2021-03-24 ENCOUNTER — Other Ambulatory Visit: Payer: Self-pay

## 2021-03-24 ENCOUNTER — Encounter: Payer: Self-pay | Admitting: Family Medicine

## 2021-03-24 DIAGNOSIS — Z1231 Encounter for screening mammogram for malignant neoplasm of breast: Secondary | ICD-10-CM | POA: Diagnosis not present

## 2021-05-02 ENCOUNTER — Other Ambulatory Visit: Payer: Self-pay | Admitting: Family Medicine

## 2021-05-02 NOTE — Telephone Encounter (Signed)
Requested Prescriptions  ?Pending Prescriptions Disp Refills  ?? levothyroxine (SYNTHROID) 25 MCG tablet [Pharmacy Med Name: LEVOTHYROXINE 25 MCG TABLET] 90 tablet 3  ?  Sig: TAKE 1 TABLET BY MOUTH DAILY BEFORE BREAKFAST.  ?  ? Endocrinology:  Hypothyroid Agents Passed - 05/02/2021  2:27 AM  ?  ?  Passed - TSH in normal range and within 360 days  ?  TSH  ?Date Value Ref Range Status  ?02/17/2021 3.940 0.450 - 4.500 uIU/mL Final  ?   ?  ?  Passed - Valid encounter within last 12 months  ?  Recent Outpatient Visits   ?      ? 2 months ago Routine general medical examination at a health care facility  ? [facility] [place], Megan P, DO  ? 8 months ago Primary hypertension  ? [facility] [place], [place] P, DO  ? 1 year ago Routine general medical examination at a health care facility  ? [facility] [place], Megan P, DO  ? 1 year ago Essential hypertension  ? [facility] [place], Megan P, DO  ? 1 year ago Mixed hyperlipidemia  ? [facility] [place], [place] P, DO  ?  ?  ?Future Appointments   ?        ? In 3 months Laural Benes, Oralia Rud, DO [facility]  ?  ? ?  ?  ?  ? ?

## 2021-07-31 ENCOUNTER — Other Ambulatory Visit: Payer: Self-pay | Admitting: Family Medicine

## 2021-07-31 NOTE — Telephone Encounter (Signed)
Requested Prescriptions  Pending Prescriptions Disp Refills  . levothyroxine (SYNTHROID) 25 MCG tablet [Pharmacy Med Name: LEVOTHYROXINE 25 MCG TABLET] 90 tablet 0    Sig: TAKE 1 TABLET BY MOUTH EVERY DAY BEFORE BREAKFAST     Endocrinology:  Hypothyroid Agents Passed - 07/31/2021  3:14 AM      Passed - TSH in normal range and within 360 days    TSH  Date Value Ref Range Status  02/17/2021 3.940 0.450 - 4.500 uIU/mL Final         Passed - Valid encounter within last 12 months    Recent Outpatient Visits          5 months ago Routine general medical examination at a health care facility   [facility], Megan P, DO   11 months ago Primary hypertension   [facility] [place], Megan P, DO   1 year ago Routine general medical examination at a health care facility   [facility] [place], [place], DO   1 year ago Essential hypertension   [facility] [place], [place], DO   2 years ago Mixed hyperlipidemia   [facility] [place], [place], DO      Future Appointments            In 2 weeks Laural Benes, Oralia Rud, DO [facility], [facility]

## 2021-08-18 ENCOUNTER — Ambulatory Visit: Payer: Medicare PPO | Admitting: Family Medicine

## 2021-08-18 ENCOUNTER — Encounter: Payer: Self-pay | Admitting: Family Medicine

## 2021-08-18 VITALS — BP 131/71 | HR 58 | Temp 98.1°F | Wt 123.2 lb

## 2021-08-18 DIAGNOSIS — I10 Essential (primary) hypertension: Secondary | ICD-10-CM | POA: Diagnosis not present

## 2021-08-18 DIAGNOSIS — E039 Hypothyroidism, unspecified: Secondary | ICD-10-CM | POA: Diagnosis not present

## 2021-08-18 DIAGNOSIS — E782 Mixed hyperlipidemia: Secondary | ICD-10-CM | POA: Diagnosis not present

## 2021-08-18 MED ORDER — LISINOPRIL 5 MG PO TABS: 5.0000 mg | ORAL_TABLET | Freq: Every day | ORAL | 1 refills | Status: DC

## 2021-08-18 NOTE — Assessment & Plan Note (Signed)
Rechecking labs today. Await results. Treat as needed.  °

## 2021-08-18 NOTE — Progress Notes (Signed)
BP 131/71   Pulse (!) 58   Temp 98.1 F (36.7 C)   Wt 123 lb 3.2 oz (55.9 kg)   SpO2 99%   BMI 24.06 kg/m    Subjective:    Patient ID: Chelsea Hughes, female    DOB: November 10, 1950, 71 y.o.   MRN: [id number]  HPI: Chelsea Hughes is a 71 y.o. female  Chief Complaint  Patient presents with   Hypertension   Hypothyroidism   Hyperlipidemia   HYPERTENSION / [place] Satisfied with current treatment? yes Duration of hypertension: chronic BP monitoring frequency: not checking BP medication side effects: no Past BP meds: lisinopril Duration of hyperlipidemia: chronic Cholesterol medication side effects: no Cholesterol supplements: fish oil Past cholesterol medications: none Medication compliance: excellent compliance Aspirin: yes Recent stressors: no Recurrent headaches: no Visual changes: no Palpitations: no Dyspnea: no Chest pain: no Lower extremity edema: no Dizzy/lightheaded: no  HYPOTHYROIDISM Thyroid control status:stable Satisfied with current treatment? yes Medication side effects: no Medication compliance: excellent compliance Etiology of hypothyroidism:  Recent dose adjustment:no Fatigue: no Cold intolerance: no Heat intolerance: no Weight gain: no Weight loss: no Constipation: no Diarrhea/loose stools: no Palpitations: no Lower extremity edema: no Anxiety/depressed mood: no  Relevant past medical, surgical, family and social history reviewed and updated as indicated. Interim medical history since our last visit reviewed. Allergies and medications reviewed and updated.  Review of Systems  Constitutional: Negative.   Respiratory: Negative.    Cardiovascular: Negative.   Gastrointestinal: Negative.   Musculoskeletal: Negative.   Neurological: Negative.   Psychiatric/Behavioral: Negative.      Per HPI unless specifically indicated above     Objective:    BP 131/71   Pulse (!) 58   Temp 98.1 F (36.7 C)   Wt 123 lb 3.2 oz (55.9  kg)   SpO2 99%   BMI 24.06 kg/m   Wt Readings from Last 3 Encounters:  08/18/21 123 lb 3.2 oz (55.9 kg)  02/17/21 122 lb 12.8 oz (55.7 kg)  08/13/20 120 lb 3.2 oz (54.5 kg)    Physical Exam Vitals and nursing note reviewed.  Constitutional:      General: She is not in acute distress.    Appearance: Normal appearance. She is normal weight. She is not ill-appearing, toxic-appearing or diaphoretic.  HENT:     Head: Normocephalic and atraumatic.     Right Ear: External ear normal.     Left Ear: External ear normal.     Nose: Nose normal.     Mouth/Throat:     Mouth: Mucous membranes are moist.     Pharynx: Oropharynx is clear.  Eyes:     General: No scleral icterus.       Right eye: No discharge.        Left eye: No discharge.     Extraocular Movements: Extraocular movements intact.     Conjunctiva/sclera: Conjunctivae normal.     Pupils: Pupils are equal, round, and reactive to light.  Cardiovascular:     Rate and Rhythm: Normal rate and regular rhythm.     Pulses: Normal pulses.     Heart sounds: Normal heart sounds. No murmur heard.    No friction rub. No gallop.  Pulmonary:     Effort: Pulmonary effort is normal. No respiratory distress.     Breath sounds: Normal breath sounds. No stridor. No wheezing, rhonchi or rales.  Chest:     Chest wall: No tenderness.  Musculoskeletal:  General: Normal range of motion.     Cervical back: Normal range of motion and neck supple.  Skin:    General: Skin is warm and dry.     Capillary Refill: Capillary refill takes less than 2 seconds.     Coloration: Skin is not jaundiced or pale.     Findings: No bruising, erythema, lesion or rash.  Neurological:     General: No focal deficit present.     Mental Status: She is alert and oriented to person, place, and time. Mental status is at baseline.  Psychiatric:        Mood and Affect: Mood normal.        Behavior: Behavior normal.        Thought Content: Thought content normal.         Judgment: Judgment normal.     Results for orders placed or performed in visit on 02/17/21  Microscopic Examination   Urine  Result Value Ref Range   WBC, UA 0-5 0 - 5 /hpf   RBC, Urine 0-2 0 - 2 /hpf   Epithelial Cells (non renal) 0-10 0 - 10 /hpf   Bacteria, UA None seen None seen/Few  Cologuard  Result Value Ref Range   COLOGUARD Negative Negative  CBC with Differential/Platelet  Result Value Ref Range   WBC 6.0 3.4 - 10.8 x10E3/uL   RBC 4.83 3.77 - 5.28 x10E6/uL   Hemoglobin 14.9 11.1 - 15.9 g/dL   Hematocrit 44.1 34.0 - 46.6 %   MCV 91 79 - 97 fL   MCH 30.8 26.6 - 33.0 pg   MCHC 33.8 31.5 - 35.7 g/dL   RDW 12.7 11.7 - 15.4 %   Platelets 304 150 - 450 x10E3/uL   Neutrophils 57 Not Estab. %   Lymphs 30 Not Estab. %   Monocytes 8 Not Estab. %   Eos 4 Not Estab. %   Basos 1 Not Estab. %   Neutrophils Absolute 3.5 1.4 - 7.0 x10E3/uL   Lymphocytes Absolute 1.8 0.7 - 3.1 x10E3/uL   Monocytes Absolute 0.5 0.1 - 0.9 x10E3/uL   EOS (ABSOLUTE) 0.2 0.0 - 0.4 x10E3/uL   Basophils Absolute 0.1 0.0 - 0.2 x10E3/uL   Immature Granulocytes 0 Not Estab. %   Immature Grans (Abs) 0.0 0.0 - 0.1 x10E3/uL  Comprehensive metabolic panel  Result Value Ref Range   Glucose 91 70 - 99 mg/dL   BUN 11 8 - 27 mg/dL   Creatinine, Ser 0.84 0.57 - 1.00 mg/dL   eGFR 74 >59 mL/min/1.73   BUN/Creatinine Ratio 13 12 - 28   Sodium 139 134 - 144 mmol/L   Potassium 4.8 3.5 - 5.2 mmol/L   Chloride 102 96 - 106 mmol/L   CO2 26 20 - 29 mmol/L   Calcium 9.2 8.7 - 10.3 mg/dL   Total Protein 6.8 6.0 - 8.5 g/dL   Albumin 4.4 3.7 - 4.7 g/dL   Globulin, Total 2.4 1.5 - 4.5 g/dL   Albumin/Globulin Ratio 1.8 1.2 - 2.2   Bilirubin Total 0.5 0.0 - 1.2 mg/dL   Alkaline Phosphatase 35 (L) 44 - 121 IU/L   AST 18 0 - 40 IU/L   ALT 12 0 - 32 IU/L  Lipid Panel w/o Chol/HDL Ratio  Result Value Ref Range   Cholesterol, Total 232 (H) 100 - 199 mg/dL   Triglycerides 133 0 - 149 mg/dL   HDL 58 >39 mg/dL    VLDL Cholesterol Cal 24 5 - 40 mg/dL  LDL Chol Calc (NIH) 150 (H) 0 - 99 mg/dL  Urinalysis, Routine w reflex microscopic  Result Value Ref Range   Specific Gravity, UA 1.015 1.005 - 1.030   pH, UA 7.0 5.0 - 7.5   Color, UA Yellow Yellow   Appearance Ur Clear Clear   Leukocytes,UA Trace (A) Negative   Protein,UA Negative Negative/Trace   Glucose, UA Negative Negative   Ketones, UA Negative Negative   RBC, UA 1+ (A) Negative   Bilirubin, UA Negative Negative   Urobilinogen, Ur 0.2 0.2 - 1.0 mg/dL   Nitrite, UA Negative Negative   Microscopic Examination See below:   TSH  Result Value Ref Range   TSH 3.940 0.450 - 4.500 uIU/mL  Microalbumin, Urine Waived  Result Value Ref Range   Microalb, Ur Waived 30 (H) 0 - 19 mg/L   Creatinine, Urine Waived 200 10 - 300 mg/dL   Microalb/Creat Ratio <30 <30 mg/g      Assessment & Plan:   Problem List Items Addressed This Visit       Cardiovascular and Mediastinum   HTN (hypertension)    Under good control on current regimen. Continue current regimen. Continue to monitor. Call with any concerns. Refills given. Labs drawn today.       Relevant Medications   lisinopril (ZESTRIL) 5 MG tablet   Other Relevant Orders   Comprehensive metabolic panel   CBC with Differential/Platelet     Endocrine   Hypothyroid - Primary    Rechecking labs today. Await results. Treat as needed.       Relevant Orders   TSH     Other   Hyperlipidemia    Rechecking labs today. Await results. Treat as needed.       Relevant Medications   lisinopril (ZESTRIL) 5 MG tablet   Other Relevant Orders   Comprehensive metabolic panel   CBC with Differential/Platelet   Lipid Panel w/o Chol/HDL Ratio     Follow up plan: Return in about 6 months (around 02/18/2022) for physical.

## 2021-08-18 NOTE — Assessment & Plan Note (Signed)
Under good control on current regimen. Continue current regimen. Continue to monitor. Call with any concerns. Refills given. Labs drawn today.   

## 2021-08-19 ENCOUNTER — Encounter: Payer: Self-pay | Admitting: Family Medicine

## 2021-08-19 LAB — CBC WITH DIFFERENTIAL/PLATELET
Basophils Absolute: 0 10*3/uL (ref 0.0–0.2)
Basos: 1 %
EOS (ABSOLUTE): 0.3 10*3/uL (ref 0.0–0.4)
Eos: 4 %
Hematocrit: 46.2 % (ref 34.0–46.6)
Hemoglobin: 15.3 g/dL (ref 11.1–15.9)
Immature Grans (Abs): 0 10*3/uL (ref 0.0–0.1)
Immature Granulocytes: 0 %
Lymphocytes Absolute: 1.6 10*3/uL (ref 0.7–3.1)
Lymphs: 25 %
MCH: 30.1 pg (ref 26.6–33.0)
MCHC: 33.1 g/dL (ref 31.5–35.7)
MCV: 91 fL (ref 79–97)
Monocytes Absolute: 0.5 10*3/uL (ref 0.1–0.9)
Monocytes: 8 %
Neutrophils Absolute: 3.9 10*3/uL (ref 1.4–7.0)
Neutrophils: 62 %
Platelets: 293 10*3/uL (ref 150–450)
RBC: 5.09 x10E6/uL (ref 3.77–5.28)
RDW: 12.8 % (ref 11.7–15.4)
WBC: 6.2 10*3/uL (ref 3.4–10.8)

## 2021-08-19 LAB — COMPREHENSIVE METABOLIC PANEL
ALT: 13 IU/L (ref 0–32)
AST: 16 IU/L (ref 0–40)
Albumin/Globulin Ratio: 2.1 (ref 1.2–2.2)
Albumin: 4.6 g/dL (ref 3.8–4.8)
Alkaline Phosphatase: 35 IU/L — ABNORMAL LOW (ref 44–121)
BUN/Creatinine Ratio: 14 (ref 12–28)
BUN: 10 mg/dL (ref 8–27)
Bilirubin Total: 0.4 mg/dL (ref 0.0–1.2)
CO2: 22 mmol/L (ref 20–29)
Calcium: 9.5 mg/dL (ref 8.7–10.3)
Chloride: 102 mmol/L (ref 96–106)
Creatinine, Ser: 0.73 mg/dL (ref 0.57–1.00)
Globulin, Total: 2.2 g/dL (ref 1.5–4.5)
Glucose: 91 mg/dL (ref 70–99)
Potassium: 4.7 mmol/L (ref 3.5–5.2)
Sodium: 140 mmol/L (ref 134–144)
Total Protein: 6.8 g/dL (ref 6.0–8.5)
eGFR: 87 mL/min/{1.73_m2} (ref >59)

## 2021-08-19 LAB — LIPID PANEL W/O CHOL/HDL RATIO
Cholesterol, Total: 230 mg/dL — ABNORMAL HIGH (ref 100–199)
HDL: 55 mg/dL (ref >39)
LDL Chol Calc (NIH): 144 mg/dL — ABNORMAL HIGH (ref 0–99)
Triglycerides: 175 mg/dL — ABNORMAL HIGH (ref 0–149)
VLDL Cholesterol Cal: 31 mg/dL (ref 5–40)

## 2021-08-19 LAB — TSH: TSH: 3.67 u[IU]/mL (ref 0.450–4.500)

## 2021-08-25 DIAGNOSIS — L570 Actinic keratosis: Secondary | ICD-10-CM | POA: Diagnosis not present

## 2021-08-25 DIAGNOSIS — L814 Other melanin hyperpigmentation: Secondary | ICD-10-CM | POA: Diagnosis not present

## 2021-08-25 DIAGNOSIS — D1801 Hemangioma of skin and subcutaneous tissue: Secondary | ICD-10-CM | POA: Diagnosis not present

## 2021-10-29 ENCOUNTER — Other Ambulatory Visit: Payer: Self-pay | Admitting: Family Medicine

## 2021-10-29 NOTE — Telephone Encounter (Signed)
Requested Prescriptions  Pending Prescriptions Disp Refills  . levothyroxine (SYNTHROID) 25 MCG tablet [Pharmacy Med Name: LEVOTHYROXINE 25 MCG TABLET] 90 tablet 0    Sig: TAKE 1 TABLET BY MOUTH EVERY DAY BEFORE BREAKFAST     Endocrinology:  Hypothyroid Agents Passed - 10/29/2021  2:16 AM      Passed - TSH in normal range and within 360 days    TSH  Date Value Ref Range Status  08/18/2021 3.670 0.450 - 4.500 uIU/mL Final         Passed - Valid encounter within last 12 months    Recent Outpatient Visits          2 months ago Acquired hypothyroidism   [facility] [place], Megan P, DO   8 months ago Routine general medical examination at a health care facility   [place], [place], DO   1 year ago Primary hypertension   [place], [place], DO   1 year ago Routine general medical examination at a health care facility   [place], [place], DO   2 years ago Essential hypertension   [facility] Valerie Roys, DO      Future Appointments            In 3 months Wynetta Emery, Barb Merino, DO [facility], [facility]

## 2022-01-14 ENCOUNTER — Other Ambulatory Visit: Payer: Self-pay | Admitting: Family Medicine

## 2022-01-15 NOTE — Telephone Encounter (Signed)
Requested medication (s) are due for refill today: yes  Requested medication (s) are on the active medication list: yes  Last refill:  02/17/21 #4/11  Future visit scheduled: yes  Notes to clinic:  Unable to refill per protocol due to failed labs, no updated results.      Requested Prescriptions  Pending Prescriptions Disp Refills   alendronate (FOSAMAX) 70 MG tablet [Pharmacy Med Name: ALENDRONATE SODIUM 70 MG TAB] 4 tablet 11    Sig: TAKE 1 TABLET (70 MG TOTAL) BY MOUTH EVERY 7 DAYS WITH FULL GLASS WATER ON EMPTY STOMACH     Endocrinology:  Bisphosphonates Failed - 01/14/2022  1:57 AM      Failed - Vitamin D in normal range and within 360 days    Vit D, 25-Hydroxy  Date Value Ref Range Status  12/13/2017 37.3 30.0 - 100.0 ng/mL Final    Comment:    Vitamin D deficiency has been defined by the Institute of Medicine and an Endocrine Society practice guideline as a level of serum 25-OH vitamin D less than 20 ng/mL (1,2). The Endocrine Society went on to further define vitamin D insufficiency as a level between 21 and 29 ng/mL (2). 1. IOM (Institute of Medicine). 2010. Dietary reference    intakes for calcium and D. [place]: The    [employer]. 2. Holick MF, Binkley Madrone, Bischoff-Ferrari HA, et al.    Evaluation, treatment, and prevention of vitamin D    deficiency: an Endocrine Society clinical practice    guideline. JCEM. 2011 Jul; 96(7):1911-30.          Failed - Mg Level in normal range and within 360 days    No results found for: "MG"       Failed - Phosphate in normal range and within 360 days    No results found for: "PHOS"       Passed - Ca in normal range and within 360 days    Calcium  Date Value Ref Range Status  08/18/2021 9.5 8.7 - 10.3 mg/dL Final         Passed - Cr in normal range and within 360 days    Creatinine, Ser  Date Value Ref Range Status  08/18/2021 0.73 0.57 - 1.00 mg/dL Final         Passed - eGFR is 30 or above and  within 360 days    GFR calc Af Amer  Date Value Ref Range Status  02/13/2020 78 >59 mL/min/1.73 Final    Comment:    **In accordance with recommendations from the NKF-ASN Task force,**   [facility] is in the process of updating its eGFR calculation to the   2021 CKD-EPI creatinine equation that estimates kidney function   without a race variable.    GFR calc non Af Amer  Date Value Ref Range Status  02/13/2020 67 >59 mL/min/1.73 Final   eGFR  Date Value Ref Range Status  08/18/2021 87 >59 mL/min/1.73 Final         Passed - Valid encounter within last 12 months    Recent Outpatient Visits           5 months ago Acquired hypothyroidism   [facility] [place], Megan P, DO   11 months ago Routine general medical examination at a health care facility   [place], [place], DO   1 year ago Primary hypertension   [place], Megan P, DO   1 year ago  Routine general medical examination at a health care facility   [place], [place], DO   2 years ago Essential hypertension   [facility] Valerie Roys, DO       Future Appointments             In 1 month Johnson, Megan P, DO [facility], [facility]            Passed - Bone Mineral Density or Dexa Scan completed in the last 2 years

## 2022-01-28 ENCOUNTER — Other Ambulatory Visit: Payer: Self-pay | Admitting: Family Medicine

## 2022-01-28 NOTE — Telephone Encounter (Signed)
Requested Prescriptions  Pending Prescriptions Disp Refills   levothyroxine (SYNTHROID) 25 MCG tablet [Pharmacy Med Name: LEVOTHYROXINE 25 MCG TABLET] 90 tablet 0    Sig: TAKE 1 TABLET BY MOUTH EVERY DAY BEFORE BREAKFAST     Endocrinology:  Hypothyroid Agents Passed - 01/28/2022  1:28 AM      Passed - TSH in normal range and within 360 days    TSH  Date Value Ref Range Status  08/18/2021 3.670 0.450 - 4.500 uIU/mL Final         Passed - Valid encounter within last 12 months    Recent Outpatient Visits           5 months ago Acquired hypothyroidism   [facility] [place], Megan P, DO   11 months ago Routine general medical examination at a health care facility   [place], [place], DO   1 year ago Primary hypertension   [place], [place], DO   1 year ago Routine general medical examination at a health care facility   [place], [place], DO   2 years ago Essential hypertension   [facility] Valerie Roys, DO       Future Appointments             In 3 weeks Wynetta Emery, Barb Merino, DO [facility], [facility]

## 2022-02-23 ENCOUNTER — Encounter: Payer: Medicare PPO | Admitting: Family Medicine

## 2022-02-23 ENCOUNTER — Ambulatory Visit (INDEPENDENT_AMBULATORY_CARE_PROVIDER_SITE_OTHER): Payer: Medicare PPO

## 2022-02-23 VITALS — Wt 123.0 lb

## 2022-02-23 DIAGNOSIS — Z1231 Encounter for screening mammogram for malignant neoplasm of breast: Secondary | ICD-10-CM

## 2022-02-23 DIAGNOSIS — Z0000 Encounter for general adult medical examination without abnormal findings: Secondary | ICD-10-CM | POA: Diagnosis not present

## 2022-02-23 NOTE — Patient Instructions (Signed)
Chelsea Hughes , Thank you for taking time to come for your Medicare Wellness Visit. I appreciate your ongoing commitment to your health goals. Please review the following plan we discussed and let me know if I can assist you in the future.   These are the goals we discussed:  Goals      DIET - EAT MORE FRUITS AND VEGETABLES     Patient Stated     12/25/2019, eat healthy and continue to exercise     Patient Stated     Continue eating healthy        This is a list of the screening recommended for you and due dates:  Health Maintenance  Topic Date Due   Zoster (Shingles) Vaccine (2 of 2) 01/07/2019   COVID-19 Vaccine (6 - 2023-24 season) 09/19/2021   Mammogram  03/25/2022   Medicare Annual Wellness Visit  02/24/2023   DEXA scan (bone density measurement)  03/05/2023   Cologuard (Stool DNA test)  02/29/2024   DTaP/Tdap/Td vaccine (4 - Td or Tdap) 02/18/2031   Pneumonia Vaccine  Completed   Flu Shot  Completed   Hepatitis C Screening: USPSTF Recommendation to screen - Ages 18-79 yo.  Completed   HPV Vaccine  Aged Out    Advanced directives: no  Conditions/risks identified: none  Next appointment: Follow up in one year for your annual wellness visit 03/01/23 @ 8:45 am by phone   Preventive Care 65 Years and Older, Female Preventive care refers to lifestyle choices and visits with your health care provider that can promote health and wellness. What does preventive care include? A yearly physical exam. This is also called an annual well check. Dental exams once or twice a year. Routine eye exams. Ask your health care provider how often you should have your eyes checked. Personal lifestyle choices, including: Daily care of your teeth and gums. Regular physical activity. Eating a healthy diet. Avoiding tobacco and drug use. Limiting alcohol use. Practicing safe sex. Taking low-dose aspirin every day. Taking vitamin and mineral supplements as recommended by your health care  provider. What happens during an annual well check? The services and screenings done by your health care provider during your annual well check will depend on your age, overall health, lifestyle risk factors, and family history of disease. Counseling  Your health care provider may ask you questions about your: Alcohol use. Tobacco use. Drug use. Emotional well-being. Home and relationship well-being. Sexual activity. Eating habits. History of falls. Memory and ability to understand (cognition). Work and work Statistician. Reproductive health. Screening  You may have the following tests or measurements: Height, weight, and BMI. Blood pressure. Lipid and cholesterol levels. These may be checked every 5 years, or more frequently if you are over 34 years old. Skin check. Lung cancer screening. You may have this screening every year starting at age 33 if you have a 30-pack-year history of smoking and currently smoke or have quit within the past 15 years. Fecal occult blood test (FOBT) of the stool. You may have this test every year starting at age 50. Flexible sigmoidoscopy or colonoscopy. You may have a sigmoidoscopy every 5 years or a colonoscopy every 10 years starting at age 49. Hepatitis C blood test. Hepatitis B blood test. Sexually transmitted disease (STD) testing. Diabetes screening. This is done by checking your blood sugar (glucose) after you have not eaten for a while (fasting). You may have this done every 1-3 years. Bone density scan. This is done to screen  for osteoporosis. You may have this done starting at age 49. Mammogram. This may be done every 1-2 years. Talk to your health care provider about how often you should have regular mammograms. Talk with your health care provider about your test results, treatment options, and if necessary, the need for more tests. Vaccines  Your health care provider may recommend certain vaccines, such as: Influenza vaccine. This is  recommended every year. Tetanus, diphtheria, and acellular pertussis (Tdap, Td) vaccine. You may need a Td booster every 10 years. Zoster vaccine. You may need this after age 33. Pneumococcal 13-valent conjugate (PCV13) vaccine. One dose is recommended after age 46. Pneumococcal polysaccharide (PPSV23) vaccine. One dose is recommended after age 75. Talk to your health care provider about which screenings and vaccines you need and how often you need them. This information is not intended to replace advice given to you by your health care provider. Make sure you discuss any questions you have with your health care provider. Document Released: 02/01/2015 Document Revised: 09/25/2015 Document Reviewed: 11/06/2014 Elsevier Interactive Patient Education  2017 [place] Prevention in the Home Falls can cause injuries. They can happen to people of all ages. There are many things you can do to make your home safe and to help prevent falls. What can I do on the outside of my home? Regularly fix the edges of walkways and driveways and fix any cracks. Remove anything that might make you trip as you walk through a door, such as a raised step or threshold. Trim any bushes or trees on the path to your home. Use bright outdoor lighting. Clear any walking paths of anything that might make someone trip, such as rocks or tools. Regularly check to see if handrails are loose or broken. Make sure that both sides of any steps have handrails. Any raised decks and porches should have guardrails on the edges. Have any leaves, snow, or ice cleared regularly. Use sand or salt on walking paths during winter. Clean up any spills in your garage right away. This includes oil or grease spills. What can I do in the bathroom? Use night lights. Install grab bars by the toilet and in the tub and shower. Do not use towel bars as grab bars. Use non-skid mats or decals in the tub or shower. If you need to sit down in  the shower, use a plastic, non-slip stool. Keep the floor dry. Clean up any water that spills on the floor as soon as it happens. Remove soap buildup in the tub or shower regularly. Attach bath mats securely with double-sided non-slip rug tape. Do not have throw rugs and other things on the floor that can make you trip. What can I do in the bedroom? Use night lights. Make sure that you have a light by your bed that is easy to reach. Do not use any sheets or blankets that are too big for your bed. They should not hang down onto the floor. Have a firm chair that has side arms. You can use this for support while you get dressed. Do not have throw rugs and other things on the floor that can make you trip. What can I do in the kitchen? Clean up any spills right away. Avoid walking on wet floors. Keep items that you use a lot in easy-to-reach places. If you need to reach something above you, use a strong step stool that has a grab bar. Keep electrical cords out of the way. Do  not use floor polish or wax that makes floors slippery. If you must use wax, use non-skid floor wax. Do not have throw rugs and other things on the floor that can make you trip. What can I do with my stairs? Do not leave any items on the stairs. Make sure that there are handrails on both sides of the stairs and use them. Fix handrails that are broken or loose. Make sure that handrails are as long as the stairways. Check any carpeting to make sure that it is firmly attached to the stairs. Fix any carpet that is loose or worn. Avoid having throw rugs at the top or bottom of the stairs. If you do have throw rugs, attach them to the floor with carpet tape. Make sure that you have a light switch at the top of the stairs and the bottom of the stairs. If you do not have them, ask someone to add them for you. What else can I do to help prevent falls? Wear shoes that: Do not have high heels. Have rubber bottoms. Are comfortable  and fit you well. Are closed at the toe. Do not wear sandals. If you use a stepladder: Make sure that it is fully opened. Do not climb a closed stepladder. Make sure that both sides of the stepladder are locked into place. Ask someone to hold it for you, if possible. Clearly mark and make sure that you can see: Any grab bars or handrails. First and last steps. Where the edge of each step is. Use tools that help you move around (mobility aids) if they are needed. These include: Canes. Walkers. Scooters. Crutches. Turn on the lights when you go into a dark area. Replace any light bulbs as soon as they burn out. Set up your furniture so you have a clear path. Avoid moving your furniture around. If any of your floors are uneven, fix them. If there are any pets around you, be aware of where they are. Review your medicines with your doctor. Some medicines can make you feel dizzy. This can increase your chance of falling. Ask your doctor what other things that you can do to help prevent falls. This information is not intended to replace advice given to you by your health care provider. Make sure you discuss any questions you have with your health care provider. Document Released: 11/01/2008 Document Revised: 06/13/2015 Document Reviewed: 02/09/2014 Elsevier Interactive Patient Education  2017 [employer].

## 2022-02-23 NOTE — Progress Notes (Signed)
Virtual Visit via Telephone Note  I connected with  Chelsea Hughes on 02/23/22 at  8:45 AM EST by telephone and verified that I am speaking with the correct person using two identifiers.  Location: Patient: home Provider: [facility] Persons participating in the virtual visit: patient/Nurse Health Advisor   I discussed the limitations, risks, security and privacy concerns of performing an evaluation and management service by telephone and the availability of in person appointments. The patient expressed understanding and agreed to proceed.  Interactive audio and video telecommunications were attempted between this nurse and patient, however failed, due to patient having technical difficulties OR patient did not have access to video capability.  We continued and completed visit with audio only.  Some vital signs may be absent or patient reported.   Dionisio David, LPN  Subjective:   Chelsea Hughes is a 72 y.o. female who presents for Medicare Annual (Subsequent) preventive examination.  Review of Systems     Cardiac Risk Factors include: advanced age (>50men, >36 women);hypertension     Objective:    There were no vitals filed for this visit. There is no height or weight on file to calculate BMI.     02/23/2022    8:49 AM 12/25/2020    8:52 AM 12/25/2019    8:16 AM 05/05/2019    9:45 AM 12/05/2018    9:32 AM 12/02/2017    1:47 PM 11/29/2015    2:19 PM  Advanced Directives  Does Patient Have a Medical Advance Directive? No No No No No No No  Would patient like information on creating a medical advance directive? No - Patient declined No - Patient declined  No - Patient declined  Yes (MAU/Ambulatory/Procedural Areas - Information given) Yes - Educational materials given    Current Medications (verified) Outpatient Encounter Medications as of 02/23/2022  Medication Sig   alendronate (FOSAMAX) 70 MG tablet TAKE 1 TABLET (70 MG TOTAL) BY MOUTH EVERY 7 DAYS WITH FULL GLASS WATER ON EMPTY  STOMACH   aspirin EC 81 MG tablet Take 81 mg by mouth daily.   Biotin 10000 MCG TABS Take by mouth.   Calcium Carbonate-Vitamin D 600-400 MG-UNIT tablet Take by mouth.   Flaxseed, Linseed, (FLAXSEED OIL) 1000 MG CAPS Take 1,000 mg by mouth daily.   levothyroxine (SYNTHROID) 25 MCG tablet TAKE 1 TABLET BY MOUTH EVERY DAY BEFORE BREAKFAST   lisinopril (ZESTRIL) 5 MG tablet Take 1 tablet (5 mg total) by mouth daily.   loratadine (CLARITIN) 10 MG tablet Take 10 mg by mouth daily.   Multiple Vitamins-Minerals (ONE DAILY CALCIUM/IRON) TABS Take by mouth.   Omega-3 Fatty Acids (FISH OIL) 1200 MG CAPS Take 1,200 mg by mouth daily.   Turmeric POWD Take by mouth.   Calcium Carbonate (CALCIUM 600 PO) Take 600 mg by mouth 2 (two) times daily. (Patient not taking: Reported on 02/23/2022)   No facility-administered encounter medications on file as of 02/23/2022.    Allergies (verified) Patient has no known allergies.   History: Past Medical History:  Diagnosis Date   Atypical squamous cell changes of undetermined significance (ASCUS) on cervical cytology with positive high risk human papilloma virus (HPV)    Diverticulosis    on Colonoscopy 2007; CT scan 2014   Microscopic hematuria    evaluated by Urology in 2014, abd/pelvis CT scan Dec 2014   Osteoporosis    Past Surgical History:  Procedure Laterality Date   MANDIBLE SURGERY  04/2019   Family History  Problem  Relation Age of Onset   Heart disease Mother        CABG   Hypertension Mother    Osteoporosis Mother    Thyroid disease Mother    Heart disease Father    Hypertension Father    Cancer Father        throat (smoker)   Hypertension Brother    Hypertension Son    COPD Neg Hx    Diabetes Neg Hx    Stroke Neg Hx    Breast cancer Neg Hx    Social History   Socioeconomic History   Marital status: Widowed    Spouse name: Not on file   Number of children: Not on file   Years of education: Not on file   Highest education level:  Not on file  Occupational History   Not on file  Tobacco Use   Smoking status: Never   Smokeless tobacco: Never  Vaping Use   Vaping Use: Never used  Substance and Sexual Activity   Alcohol use: Yes    Alcohol/week: 7.0 standard drinks of alcohol    Types: 7 Glasses of wine per week    Comment: wine   Drug use: No   Sexual activity: Not Currently  Other Topics Concern   Not on file  Social History Narrative   Not on file   Social Determinants of Health   Financial Resource Strain: Low Risk  (02/23/2022)   Overall Financial Resource Strain (CARDIA)    Difficulty of Paying Living Expenses: Not hard at all  Food Insecurity: No Food Insecurity (02/23/2022)   Hunger Vital Sign    Worried About Running Out of Food in the Last Year: Never true    Ran Out of Food in the Last Year: Never true  Transportation Needs: No Transportation Needs (02/23/2022)   PRAPARE - Administrator, Civil Service (Medical): No    Lack of Transportation (Non-Medical): No  Physical Activity: Sufficiently Active (02/23/2022)   Exercise Vital Sign    Days of Exercise per Week: 5 days    Minutes of Exercise per Session: 30 min  Stress: No Stress Concern Present (02/23/2022)   [employer] of Occupational Health - Occupational Stress Questionnaire    Feeling of Stress : Not at all  Social Connections: Moderately Integrated (02/23/2022)   Social Connection and Isolation Panel [NHANES]    Frequency of Communication with Friends and Family: More than three times a week    Frequency of Social Gatherings with Friends and Family: More than three times a week    Attends Religious Services: More than 4 times per year    Active Member of [employer] or Organizations: Yes    Attends Banker Meetings: More than 4 times per year    Marital Status: Widowed    Tobacco Counseling Counseling given: Not Answered   Clinical Intake:  Pre-visit preparation completed: Yes  Pain : No/denies pain      Nutritional Risks: None Diabetes: No  How often do you need to have someone help you when you read instructions, pamphlets, or other written materials from your doctor or pharmacy?: 1 - Never  Diabetic?no  Interpreter Needed?: No  Information entered by :: Kennedy Bucker, LPN   Activities of Daily Living    02/23/2022    8:50 AM  In your present state of health, do you have any difficulty performing the following activities:  Hearing? 0  Vision? 0  Difficulty concentrating or making decisions? 0  Walking or climbing stairs? 0  Dressing or bathing? 0  Doing errands, shopping? 0  Preparing Food and eating ? N  Using the Toilet? N  In the past six months, have you accidently leaked urine? N  Do you have problems with loss of bowel control? N  Managing your Medications? N  Managing your Finances? N  Housekeeping or managing your Housekeeping? N    Patient Care Team: Valerie Roys, DO as PCP - General (Family Medicine) Christene Slates, MD (Dermatology)  Indicate any recent Medical Services you may have received from other than [facility] providers in the past year (date may be approximate).     Assessment:   This is a routine wellness examination for [place].  Hearing/Vision screen Hearing Screening - Comments:: No aids Vision Screening - Comments:: Readers- Dr. Ellin Mayhew  Dietary issues and exercise activities discussed: Current Exercise Habits: Home exercise routine, Type of exercise: walking, Time (Minutes): 35, Frequency (Times/Week): 5, Weekly Exercise (Minutes/Week): 175, Intensity: Mild   Goals Addressed             This Visit's Progress    DIET - EAT MORE FRUITS AND VEGETABLES         Depression Screen    02/23/2022    8:48 AM 02/17/2021    8:11 AM 12/25/2020    8:47 AM 02/13/2020    8:55 AM 12/25/2019    8:18 AM 12/19/2018    8:05 AM 12/05/2018    9:32 AM  PHQ 2/9 Scores  PHQ - 2 Score 0 0 0 0 0 0 0  PHQ- 9 Score 0 0         Fall Risk    02/23/2022     8:50 AM 02/17/2021    8:11 AM 12/25/2020    8:36 AM 12/25/2019    8:17 AM 12/19/2018    8:05 AM  Fall Risk   Falls in the past year? 0 0 0 1 0  Comment    tripped while gardening   Number falls in past yr: 0 0 0 0 0  Injury with Fall? 0 0 0 1 0  Risk for fall due to : No Fall Risks No Fall Risks  History of fall(s);Medication side effect   Follow up Falls prevention discussed;Falls evaluation completed Falls evaluation completed Falls evaluation completed Falls evaluation completed;Education provided;Falls prevention discussed     FALL RISK PREVENTION PERTAINING TO THE HOME:  Any stairs in or around the home? Yes  If so, are there any without handrails? No  Home free of loose throw rugs in walkways, pet beds, electrical cords, etc? Yes  Adequate lighting in your home to reduce risk of falls? Yes   ASSISTIVE DEVICES UTILIZED TO PREVENT FALLS:  Life alert? No  Use of a cane, walker or w/c? No  Grab bars in the bathroom? No  Shower chair or bench in shower? No  Elevated toilet seat or a handicapped toilet? Yes    Cognitive Function:        02/23/2022    8:55 AM 12/25/2019    8:19 AM 12/02/2017    1:49 PM 11/30/2016    2:03 PM 11/29/2015    2:23 PM  6CIT Screen  What Year? 0 points 0 points 0 points 0 points 0 points  What month? 0 points 0 points 0 points 0 points 0 points  What time? 0 points 0 points 0 points 0 points 0 points  Count back from 20 0 points 0 points 0  points 0 points 0 points  Months in reverse 0 points 2 points 0 points  0 points  Repeat phrase 0 points 0 points 0 points 0 points 4 points  Total Score 0 points 2 points 0 points  4 points    Immunizations Immunization History  Administered Date(s) Administered   Fluad Quad(high Dose 65+) 10/02/2020, 09/26/2021   Influenza, High Dose Seasonal PF 11/06/2017, 09/19/2018, 10/20/2019, 10/30/2020   Influenza-Unspecified 09/20/2014, 10/16/2015, 11/09/2016   PFIZER Comirnaty(Gray Top)Covid-19 Tri-Sucrose  Vaccine 04/30/2020   PFIZER(Purple Top)SARS-COV-2 Vaccination 02/28/2019, 03/21/2019, 11/02/2019   Pfizer Covid-19 Vaccine Bivalent Booster 57yrs & up 10/23/2020   Pneumococcal Conjugate-13 01/01/2014   Pneumococcal Polysaccharide-23 11/29/2015   Td 08/07/2004, 02/17/2021   Tdap 09/29/2010   Zoster Recombinat (Shingrix) 11/12/2018   Zoster, Live 02/20/2010    TDAP status: Up to date  Flu Vaccine status: Up to date  Pneumococcal vaccine status: Up to date  Covid-19 vaccine status: Completed vaccines  Qualifies for Shingles Vaccine? Yes   Zostavax completed Yes   Shingrix Completed?: No.    Education has been provided regarding the importance of this vaccine. Patient has been advised to call insurance company to determine out of pocket expense if they have not yet received this vaccine. Advised may also receive vaccine at local pharmacy or Health Dept. Verbalized acceptance and understanding.  Screening Tests Health Maintenance  Topic Date Due   Zoster Vaccines- Shingrix (2 of 2) 01/07/2019   COVID-19 Vaccine (6 - 2023-24 season) 09/19/2021   MAMMOGRAM  03/25/2022   Medicare Annual Wellness (AWV)  02/24/2023   DEXA SCAN  03/05/2023   Fecal DNA (Cologuard)  02/29/2024   DTaP/Tdap/Td (4 - Td or Tdap) 02/18/2031   Pneumonia Vaccine 43+ Years old  Completed   INFLUENZA VACCINE  Completed   Hepatitis C Screening  Completed   HPV VACCINES  Aged Out    Health Maintenance  Health Maintenance Due  Topic Date Due   Zoster Vaccines- Shingrix (2 of 2) 01/07/2019   COVID-19 Vaccine (6 - 2023-24 season) 09/19/2021    Colorectal cancer screening: Type of screening: Cologuard. Completed 02/28/21. Repeat every 3 years  Mammogram status: Completed 03/24/21. Repeat every year  Bone Density status: Completed 03/04/20. Results reflect: Bone density results: OSTEOPENIA. Repeat every 3 years.  Lung Cancer Screening: (Low Dose CT Chest recommended if Age 72-80 years, 30 pack-year currently  smoking OR have quit w/in 15years.) does not qualify.   Additional Screening:  Hepatitis C Screening: does qualify; Completed 11/29/15  Vision Screening: Recommended annual ophthalmology exams for early detection of glaucoma and other disorders of the eye. Is the patient up to date with their annual eye exam?  Yes  Who is the provider or what is the name of the office in which the patient attends annual eye exams? Dr.Woodard If pt is not established with a provider, would they like to be referred to a provider to establish care? No .   Dental Screening: Recommended annual dental exams for proper oral hygiene  Community Resource Referral / Chronic Care Management: CRR required this visit?  No   CCM required this visit?  No      Plan:     I have personally reviewed and noted the following in the patient's chart:   Medical and social history Use of alcohol, tobacco or illicit drugs  Current medications and supplements including opioid prescriptions. Patient is not currently taking opioid prescriptions. Functional ability and status Nutritional status Physical activity Advanced directives List  of other physicians Hospitalizations, surgeries, and ER visits in previous 12 months Vitals Screenings to include cognitive, depression, and falls Referrals and appointments  In addition, I have reviewed and discussed with patient certain preventive protocols, quality metrics, and best practice recommendations. A written personalized care plan for preventive services as well as general preventive health recommendations were provided to patient.     Dionisio David, LPN   09/25/6732   Nurse Notes: none

## 2022-03-23 ENCOUNTER — Encounter: Payer: Self-pay | Admitting: Family Medicine

## 2022-03-23 ENCOUNTER — Ambulatory Visit (INDEPENDENT_AMBULATORY_CARE_PROVIDER_SITE_OTHER): Payer: Medicare PPO | Admitting: Family Medicine

## 2022-03-23 VITALS — BP 122/69 | HR 60 | Temp 98.3°F | Ht 60.5 in | Wt 125.6 lb

## 2022-03-23 DIAGNOSIS — E782 Mixed hyperlipidemia: Secondary | ICD-10-CM | POA: Diagnosis not present

## 2022-03-23 DIAGNOSIS — D692 Other nonthrombocytopenic purpura: Secondary | ICD-10-CM | POA: Insufficient documentation

## 2022-03-23 DIAGNOSIS — M810 Age-related osteoporosis without current pathological fracture: Secondary | ICD-10-CM

## 2022-03-23 DIAGNOSIS — I10 Essential (primary) hypertension: Secondary | ICD-10-CM | POA: Diagnosis not present

## 2022-03-23 DIAGNOSIS — Z0000 Encounter for general adult medical examination without abnormal findings: Secondary | ICD-10-CM

## 2022-03-23 DIAGNOSIS — E039 Hypothyroidism, unspecified: Secondary | ICD-10-CM

## 2022-03-23 LAB — URINALYSIS, ROUTINE W REFLEX MICROSCOPIC
Bilirubin, UA: NEGATIVE
Glucose, UA: NEGATIVE
Ketones, UA: NEGATIVE
Leukocytes,UA: NEGATIVE
Nitrite, UA: NEGATIVE
Protein,UA: NEGATIVE
Specific Gravity, UA: 1.015 (ref 1.005–1.030)
Urobilinogen, Ur: 0.2 mg/dL (ref 0.2–1.0)
pH, UA: 7 (ref 5.0–7.5)

## 2022-03-23 LAB — MICROSCOPIC EXAMINATION
Bacteria, UA: NONE SEEN
Epithelial Cells (non renal): NONE SEEN /hpf (ref 0–10)

## 2022-03-23 LAB — MICROALBUMIN, URINE WAIVED
Creatinine, Urine Waived: 50 mg/dL (ref 10–300)
Microalb, Ur Waived: 10 mg/L (ref 0–19)
Microalb/Creat Ratio: <30 mg/g (ref <30)

## 2022-03-23 MED ORDER — LISINOPRIL 5 MG PO TABS: 5.0000 mg | ORAL_TABLET | Freq: Every day | ORAL | 1 refills | Status: DC

## 2022-03-23 NOTE — Assessment & Plan Note (Signed)
Under good control on current regimen. Continue current regimen. Continue to monitor. Call with any concerns. Refills given. Labs drawn today.   

## 2022-03-23 NOTE — Assessment & Plan Note (Signed)
Rechecking labs today. Await results. Treat as needed.  °

## 2022-03-23 NOTE — Progress Notes (Signed)
BP 122/69   Pulse 60   Temp 98.3 F (36.8 C) (Oral)   Ht 5' 0.5" (1.537 m)   Wt 125 lb 9.6 oz (57 kg)   SpO2 98%   BMI 24.13 kg/m    Subjective:    Patient ID: Chelsea Hughes, female    DOB: 01/29/1950, 72 y.o.   MRN: [id number]  HPI: Chelsea Hughes is a 72 y.o. female presenting on 03/23/2022 for comprehensive medical examination. Current medical complaints include:  HYPERTENSION / HYPERLIPIDEMIA Satisfied with current treatment? yes Duration of hypertension: chronic BP monitoring frequency: not checking BP medication side effects: no Past BP meds: lisinopril Duration of hyperlipidemia: chronic Cholesterol medication side effects: no Cholesterol supplements:  flaxseed oil, fish oil Past cholesterol medications: none Medication compliance: excellent compliance Aspirin: yes Recent stressors: no Recurrent headaches: no Visual changes: no Palpitations: no Dyspnea: no Chest pain: no Lower extremity edema: no Dizzy/lightheaded: no  HYPOTHYROIDISM Thyroid control status:stable Satisfied with current treatment? yes Medication side effects: no Medication compliance: excellent compliance Recent dose adjustment:no Fatigue: no Cold intolerance: no Heat intolerance: no Weight gain: no Weight loss: no Constipation: no Diarrhea/loose stools: no Palpitations: no Lower extremity edema: no Anxiety/depressed mood: no  Menopausal Symptoms: no  Depression Screen done today and results listed below:     03/23/2022   10:17 AM 02/23/2022    8:48 AM 02/17/2021    8:11 AM 12/25/2020    8:47 AM 02/13/2020    8:55 AM  Depression screen PHQ 2/9  Decreased Interest 0 0 0 0 0  Down, Depressed, Hopeless 0 0 0 0 0  PHQ - 2 Score 0 0 0 0 0  Altered sleeping 1 0 0    Tired, decreased energy 0 0 0    Change in appetite 0 0 0    Feeling bad or failure about yourself  0 0 0    Trouble concentrating 0 0 0    Moving slowly or fidgety/restless 0 0 0    Suicidal thoughts 0 0 0    PHQ-9  Score 1 0 0    Difficult doing work/chores Not difficult at all Not difficult at all       Past Medical History:  Past Medical History:  Diagnosis Date   Atypical squamous cell changes of undetermined significance (ASCUS) on cervical cytology with positive high risk human papilloma virus (HPV)    Diverticulosis    on Colonoscopy 2007; CT scan 2014   Microscopic hematuria    evaluated by Urology in 2014, abd/pelvis CT scan Dec 2014   Osteoporosis     Surgical History:  Past Surgical History:  Procedure Laterality Date   MANDIBLE SURGERY  04/2019    Medications:  Current Outpatient Medications on File Prior to Visit  Medication Sig   alendronate (FOSAMAX) 70 MG tablet TAKE 1 TABLET (70 MG TOTAL) BY MOUTH EVERY 7 DAYS WITH FULL GLASS WATER ON EMPTY STOMACH   aspirin EC 81 MG tablet Take 81 mg by mouth daily.   Biotin 10000 MCG TABS Take by mouth.   Calcium Carbonate (CALCIUM 600 PO) Take 600 mg by mouth 2 (two) times daily.   Calcium Carbonate-Vitamin D 600-400 MG-UNIT tablet Take by mouth.   Flaxseed, Linseed, (FLAXSEED OIL) 1000 MG CAPS Take 1,000 mg by mouth daily.   levothyroxine (SYNTHROID) 25 MCG tablet TAKE 1 TABLET BY MOUTH EVERY DAY BEFORE BREAKFAST   loratadine (CLARITIN) 10 MG tablet Take 10 mg by mouth daily.   Multiple  Vitamins-Minerals (ONE DAILY CALCIUM/IRON) TABS Take by mouth.   Omega-3 Fatty Acids (FISH OIL) 1200 MG CAPS Take 1,200 mg by mouth daily.   Turmeric POWD Take by mouth.   No current facility-administered medications on file prior to visit.    Allergies:  No Known Allergies  Social History:  Social History   Socioeconomic History   Marital status: Widowed    Spouse name: Not on file   Number of children: Not on file   Years of education: Not on file   Highest education level: Not on file  Occupational History   Not on file  Tobacco Use   Smoking status: Never   Smokeless tobacco: Never  Vaping Use   Vaping Use: Never used  Substance  and Sexual Activity   Alcohol use: Yes    Alcohol/week: 7.0 standard drinks of alcohol    Types: 7 Glasses of wine per week    Comment: wine   Drug use: No   Sexual activity: Not Currently  Other Topics Concern   Not on file  Social History Narrative   Not on file   Social Determinants of Health   Financial Resource Strain: Low Risk  (02/23/2022)   Overall Financial Resource Strain (CARDIA)    Difficulty of Paying Living Expenses: Not hard at all  Food Insecurity: No Food Insecurity (02/23/2022)   Hunger Vital Sign    Worried About Running Out of Food in the Last Year: Never true    Ran Out of Food in the Last Year: Never true  Transportation Needs: No Transportation Needs (02/23/2022)   PRAPARE - Hydrologist (Medical): No    Lack of Transportation (Non-Medical): No  Physical Activity: Sufficiently Active (02/23/2022)   Exercise Vital Sign    Days of Exercise per Week: 5 days    Minutes of Exercise per Session: 30 min  Stress: No Stress Concern Present (02/23/2022)   [place]    Feeling of Stress : Not at all  Social Connections: Moderately Integrated (02/23/2022)   Social Connection and Isolation Panel [NHANES]    Frequency of Communication with Friends and Family: More than three times a week    Frequency of Social Gatherings with Friends and Family: More than three times a week    Attends Religious Services: More than 4 times per year    Active Member of [employer] or Organizations: Yes    Attends Archivist Meetings: More than 4 times per year    Marital Status: Widowed  Intimate Partner Violence: Not At Risk (02/23/2022)   Humiliation, Afraid, Rape, and Kick questionnaire    Fear of Current or Ex-Partner: No    Emotionally Abused: No    Physically Abused: No    Sexually Abused: No   Social History   Tobacco Use  Smoking Status Never  Smokeless Tobacco Never   Social  History   Substance and Sexual Activity  Alcohol Use Yes   Alcohol/week: 7.0 standard drinks of alcohol   Types: 7 Glasses of wine per week   Comment: wine    Family History:  Family History  Problem Relation Age of Onset   Heart disease Mother        CABG   Hypertension Mother    Osteoporosis Mother    Thyroid disease Mother    Heart disease Father    Hypertension Father    Cancer Father  throat (smoker)   Hypertension Brother    Hypertension Son    COPD Neg Hx    Diabetes Neg Hx    Stroke Neg Hx    Breast cancer Neg Hx     Past medical history, surgical history, medications, allergies, family history and social history reviewed with patient today and changes made to appropriate areas of the chart.   Review of Systems  Constitutional: Negative.   HENT: Negative.    Eyes: Negative.   Respiratory: Negative.    Cardiovascular: Negative.   Gastrointestinal: Negative.   Genitourinary: Negative.   Musculoskeletal: Negative.   Skin: Negative.   Neurological: Negative.   Endo/Heme/Allergies:  Positive for environmental allergies. Negative for polydipsia. Bruises/bleeds easily.  Psychiatric/Behavioral: Negative.     All other ROS negative except what is listed above and in the HPI.      Objective:    BP 122/69   Pulse 60   Temp 98.3 F (36.8 C) (Oral)   Ht 5' 0.5" (1.537 m)   Wt 125 lb 9.6 oz (57 kg)   SpO2 98%   BMI 24.13 kg/m   Wt Readings from Last 3 Encounters:  03/23/22 125 lb 9.6 oz (57 kg)  02/23/22 123 lb (55.8 kg)  08/18/21 123 lb 3.2 oz (55.9 kg)    Physical Exam Vitals and nursing note reviewed.  Constitutional:      General: She is not in acute distress.    Appearance: Normal appearance. She is not ill-appearing, toxic-appearing or diaphoretic.  HENT:     Head: Normocephalic and atraumatic.     Right Ear: Tympanic membrane, ear canal and external ear normal. There is no impacted cerumen.     Left Ear: Tympanic membrane, ear canal and  external ear normal. There is no impacted cerumen.     Nose: Nose normal. No congestion or rhinorrhea.     Mouth/Throat:     Mouth: Mucous membranes are moist.     Pharynx: Oropharynx is clear. No oropharyngeal exudate or posterior oropharyngeal erythema.  Eyes:     General: No scleral icterus.       Right eye: No discharge.        Left eye: No discharge.     Extraocular Movements: Extraocular movements intact.     Conjunctiva/sclera: Conjunctivae normal.     Pupils: Pupils are equal, round, and reactive to light.  Neck:     Vascular: No carotid bruit.  Cardiovascular:     Rate and Rhythm: Normal rate and regular rhythm.     Pulses: Normal pulses.     Heart sounds: No murmur heard.    No friction rub. No gallop.  Pulmonary:     Effort: Pulmonary effort is normal. No respiratory distress.     Breath sounds: Normal breath sounds. No stridor. No wheezing, rhonchi or rales.  Chest:     Chest wall: No tenderness.  Abdominal:     General: Abdomen is flat. Bowel sounds are normal. There is no distension.     Palpations: Abdomen is soft. There is no mass.     Tenderness: There is no abdominal tenderness. There is no right CVA tenderness, left CVA tenderness, guarding or rebound.     Hernia: No hernia is present.  Genitourinary:    Comments: Breast and pelvic exams deferred with shared decision making Musculoskeletal:        General: No swelling, tenderness, deformity or signs of injury.     Cervical back: Normal range of motion and  neck supple. No rigidity. No muscular tenderness.     Right lower leg: No edema.     Left lower leg: No edema.  Lymphadenopathy:     Cervical: No cervical adenopathy.  Skin:    General: Skin is warm and dry.     Capillary Refill: Capillary refill takes less than 2 seconds.     Coloration: Skin is not jaundiced or pale.     Findings: No bruising, erythema, lesion or rash.  Neurological:     General: No focal deficit present.     Mental Status: She is  alert and oriented to person, place, and time. Mental status is at baseline.     Cranial Nerves: No cranial nerve deficit.     Sensory: No sensory deficit.     Motor: No weakness.     Coordination: Coordination normal.     Gait: Gait normal.     Deep Tendon Reflexes: Reflexes normal.  Psychiatric:        Mood and Affect: Mood normal.        Behavior: Behavior normal.        Thought Content: Thought content normal.        Judgment: Judgment normal.     Results for orders placed or performed in visit on 08/18/21  Comprehensive metabolic panel  Result Value Ref Range   Glucose 91 70 - 99 mg/dL   BUN 10 8 - 27 mg/dL   Creatinine, Ser 0.73 0.57 - 1.00 mg/dL   eGFR 87 >59 mL/min/1.73   BUN/Creatinine Ratio 14 12 - 28   Sodium 140 134 - 144 mmol/L   Potassium 4.7 3.5 - 5.2 mmol/L   Chloride 102 96 - 106 mmol/L   CO2 22 20 - 29 mmol/L   Calcium 9.5 8.7 - 10.3 mg/dL   Total Protein 6.8 6.0 - 8.5 g/dL   Albumin 4.6 3.8 - 4.8 g/dL   Globulin, Total 2.2 1.5 - 4.5 g/dL   Albumin/Globulin Ratio 2.1 1.2 - 2.2   Bilirubin Total 0.4 0.0 - 1.2 mg/dL   Alkaline Phosphatase 35 (L) 44 - 121 IU/L   AST 16 0 - 40 IU/L   ALT 13 0 - 32 IU/L  CBC with Differential/Platelet  Result Value Ref Range   WBC 6.2 3.4 - 10.8 x10E3/uL   RBC 5.09 3.77 - 5.28 x10E6/uL   Hemoglobin 15.3 11.1 - 15.9 g/dL   Hematocrit 46.2 34.0 - 46.6 %   MCV 91 79 - 97 fL   MCH 30.1 26.6 - 33.0 pg   MCHC 33.1 31.5 - 35.7 g/dL   RDW 12.8 11.7 - 15.4 %   Platelets 293 150 - 450 x10E3/uL   Neutrophils 62 Not Estab. %   Lymphs 25 Not Estab. %   Monocytes 8 Not Estab. %   Eos 4 Not Estab. %   Basos 1 Not Estab. %   Neutrophils Absolute 3.9 1.4 - 7.0 x10E3/uL   Lymphocytes Absolute 1.6 0.7 - 3.1 x10E3/uL   Monocytes Absolute 0.5 0.1 - 0.9 x10E3/uL   EOS (ABSOLUTE) 0.3 0.0 - 0.4 x10E3/uL   Basophils Absolute 0.0 0.0 - 0.2 x10E3/uL   Immature Granulocytes 0 Not Estab. %   Immature Grans (Abs) 0.0 0.0 - 0.1 x10E3/uL  TSH   Result Value Ref Range   TSH 3.670 0.450 - 4.500 uIU/mL  Lipid Panel w/o Chol/HDL Ratio  Result Value Ref Range   Cholesterol, Total 230 (H) 100 - 199 mg/dL   Triglycerides 175 (H)  0 - 149 mg/dL   HDL 55 >39 mg/dL   VLDL Cholesterol Cal 31 5 - 40 mg/dL   LDL Chol Calc (NIH) 144 (H) 0 - 99 mg/dL      Assessment & Plan:   Problem List Items Addressed This Visit       Cardiovascular and Mediastinum   HTN (hypertension)    Under good control on current regimen. Continue current regimen. Continue to monitor. Call with any concerns. Refills given. Labs drawn today.       Relevant Medications   lisinopril (ZESTRIL) 5 MG tablet   Other Relevant Orders   CBC with Differential/Platelet   Comprehensive metabolic panel   Urinalysis, Routine w reflex microscopic   Microalbumin, Urine Waived   Senile purpura (HCC)    Reassured patient. Continue to monitor.       Relevant Medications   lisinopril (ZESTRIL) 5 MG tablet     Endocrine   Hypothyroid    Rechecking labs today. Await results. Treat as needed.       Relevant Orders   CBC with Differential/Platelet   Comprehensive metabolic panel   TSH     Musculoskeletal and Integument   Osteoporosis    Stable. Due for recheck next year. Continue fosamax. Call with any concerns.       Relevant Orders   CBC with Differential/Platelet   Comprehensive metabolic panel     Other   Hyperlipidemia    Rechecking labs today. Await results. Treat as needed.        Relevant Medications   lisinopril (ZESTRIL) 5 MG tablet   Other Relevant Orders   CBC with Differential/Platelet   Comprehensive metabolic panel   Lipid Panel w/o Chol/HDL Ratio   Other Visit Diagnoses     Routine general medical examination at a health care facility    -  Primary   Vaccines up to date. Screening labs checked today. Colonoscopy, mammo and DEXA up to date. Continue diet and exercise. Call with any concerns.        Follow up plan: Return in about  6 months (around 09/23/2022).   LABORATORY TESTING:  - Pap smear: not applicable  IMMUNIZATIONS:   - Tdap: Tetanus vaccination status reviewed: last tetanus booster within 10 years. - Influenza: Up to date - Pneumovax: Up to date - Prevnar: Up to date - COVID: Up to date - HPV: Not applicable - Shingrix vaccine: up to date  SCREENING: -Mammogram: Up to date  - Colonoscopy: Up to date  - Bone Density: Up to date   PATIENT COUNSELING:   Advised to take 1 mg of folate supplement per day if capable of pregnancy.   Sexuality: Discussed sexually transmitted diseases, partner selection, use of condoms, avoidance of unintended pregnancy  and contraceptive alternatives.   Advised to avoid cigarette smoking.  I discussed with the patient that most people either abstain from alcohol or drink within safe limits (<=14/week and <=4 drinks/occasion for males, <=7/weeks and <= 3 drinks/occasion for females) and that the risk for alcohol disorders and other health effects rises proportionally with the number of drinks per week and how often a drinker exceeds daily limits.  Discussed cessation/primary prevention of drug use and availability of treatment for abuse.   Diet: Encouraged to adjust caloric intake to maintain  or achieve ideal body weight, to reduce intake of dietary saturated fat and total fat, to limit sodium intake by avoiding high sodium foods and not adding table salt, and to maintain adequate  dietary potassium and calcium preferably from fresh fruits, vegetables, and low-fat dairy products.    stressed the importance of regular exercise  Injury prevention: Discussed safety belts, safety helmets, smoke detector, smoking near bedding or upholstery.   Dental health: Discussed importance of regular tooth brushing, flossing, and dental visits.    NEXT PREVENTATIVE PHYSICAL DUE IN 1 YEAR. Return in about 6 months (around 09/23/2022).

## 2022-03-23 NOTE — Assessment & Plan Note (Signed)
Reassured patient. Continue to monitor.  

## 2022-03-23 NOTE — Assessment & Plan Note (Signed)
Stable. Due for recheck next year. Continue fosamax. Call with any concerns.

## 2022-03-23 NOTE — Patient Instructions (Signed)
Please call to schedule your mammogram anytime after 03/25/22: [facility] [place]: [address], [place], [place] [address] Phone: [phone]  [place] [facility] [address] [place],  [place]  [address] Phone: [phone]

## 2022-03-24 ENCOUNTER — Encounter: Payer: Self-pay | Admitting: Family Medicine

## 2022-03-24 ENCOUNTER — Other Ambulatory Visit: Payer: Self-pay | Admitting: Family Medicine

## 2022-03-24 LAB — LIPID PANEL W/O CHOL/HDL RATIO
Cholesterol, Total: 218 mg/dL — ABNORMAL HIGH (ref 100–199)
HDL: 53 mg/dL (ref >39)
LDL Chol Calc (NIH): 129 mg/dL — ABNORMAL HIGH (ref 0–99)
Triglycerides: 206 mg/dL — ABNORMAL HIGH (ref 0–149)
VLDL Cholesterol Cal: 36 mg/dL (ref 5–40)

## 2022-03-24 LAB — COMPREHENSIVE METABOLIC PANEL
ALT: 11 IU/L (ref 0–32)
AST: 18 IU/L (ref 0–40)
Albumin/Globulin Ratio: 2 (ref 1.2–2.2)
Albumin: 4.4 g/dL (ref 3.8–4.8)
Alkaline Phosphatase: 37 IU/L — ABNORMAL LOW (ref 44–121)
BUN/Creatinine Ratio: 18 (ref 12–28)
BUN: 14 mg/dL (ref 8–27)
Bilirubin Total: 0.4 mg/dL (ref 0.0–1.2)
CO2: 25 mmol/L (ref 20–29)
Calcium: 9.6 mg/dL (ref 8.7–10.3)
Chloride: 98 mmol/L (ref 96–106)
Creatinine, Ser: 0.79 mg/dL (ref 0.57–1.00)
Globulin, Total: 2.2 g/dL (ref 1.5–4.5)
Glucose: 84 mg/dL (ref 70–99)
Potassium: 4.8 mmol/L (ref 3.5–5.2)
Sodium: 137 mmol/L (ref 134–144)
Total Protein: 6.6 g/dL (ref 6.0–8.5)
eGFR: 79 mL/min/{1.73_m2} (ref >59)

## 2022-03-24 LAB — CBC WITH DIFFERENTIAL/PLATELET
Basophils Absolute: 0 10*3/uL (ref 0.0–0.2)
Basos: 1 %
EOS (ABSOLUTE): 0.1 10*3/uL (ref 0.0–0.4)
Eos: 2 %
Hematocrit: 43.8 % (ref 34.0–46.6)
Hemoglobin: 14.3 g/dL (ref 11.1–15.9)
Immature Grans (Abs): 0 10*3/uL (ref 0.0–0.1)
Immature Granulocytes: 0 %
Lymphocytes Absolute: 1.4 10*3/uL (ref 0.7–3.1)
Lymphs: 17 %
MCH: 30 pg (ref 26.6–33.0)
MCHC: 32.6 g/dL (ref 31.5–35.7)
MCV: 92 fL (ref 79–97)
Monocytes Absolute: 0.6 10*3/uL (ref 0.1–0.9)
Monocytes: 8 %
Neutrophils Absolute: 5.7 10*3/uL (ref 1.4–7.0)
Neutrophils: 72 %
Platelets: 286 10*3/uL (ref 150–450)
RBC: 4.77 x10E6/uL (ref 3.77–5.28)
RDW: 12.7 % (ref 11.7–15.4)
WBC: 7.9 10*3/uL (ref 3.4–10.8)

## 2022-03-24 LAB — TSH: TSH: 2.31 u[IU]/mL (ref 0.450–4.500)

## 2022-03-24 MED ORDER — LEVOTHYROXINE SODIUM 25 MCG PO TABS: ORAL_TABLET | ORAL | 3 refills | Status: DC

## 2022-04-07 DIAGNOSIS — H2513 Age-related nuclear cataract, bilateral: Secondary | ICD-10-CM | POA: Diagnosis not present

## 2022-04-15 ENCOUNTER — Ambulatory Visit
Admission: RE | Admit: 2022-04-15 | Discharge: 2022-04-15 | Disposition: A | Discharge status: Home / Self Care | Payer: Medicare PPO | Source: Ambulatory Visit | Attending: Family Medicine | Admitting: Family Medicine

## 2022-04-15 DIAGNOSIS — Z1231 Encounter for screening mammogram for malignant neoplasm of breast: Secondary | ICD-10-CM

## 2022-04-16 ENCOUNTER — Encounter: Payer: Self-pay | Admitting: Family Medicine

## 2022-06-06 ENCOUNTER — Ambulatory Visit
Admission: EM | Admit: 2022-06-06 | Discharge: 2022-06-06 | Disposition: A | Discharge status: Home / Self Care | Payer: Medicare PPO | Attending: Family Medicine | Admitting: Family Medicine

## 2022-06-06 DIAGNOSIS — J0100 Acute maxillary sinusitis, unspecified: Secondary | ICD-10-CM | POA: Diagnosis not present

## 2022-06-06 MED ORDER — AMOXICILLIN-POT CLAVULANATE 875-125 MG PO TABS: 1.0000 | ORAL_TABLET | Freq: Two times a day (BID) | ORAL | 0 refills | Status: DC

## 2022-06-06 NOTE — ED Provider Notes (Signed)
[facility]    CSN: [id number] Arrival date & time: 06/06/22  1144      History   Chief Complaint Chief Complaint  Patient presents with   Nasal Congestion   Cough    HPI Chelsea Hughes is a 72 y.o. female.   HPI   Chelsea Hughes presents for nasal congestion and slight cough for the past 2 weeks. Has some sinus pressure.  She has a scratchy throat. Taking OTC meds without relief.  Symptoms are not getting any better.  No fever. Has intermittent headache.     Past Medical History:  Diagnosis Date   Atypical squamous cell changes of undetermined significance (ASCUS) on cervical cytology with positive high risk human papilloma virus (HPV)    Diverticulosis    on Colonoscopy 2007; CT scan 2014   Microscopic hematuria    evaluated by Urology in 2014, abd/pelvis CT scan Dec 2014   Osteoporosis     Patient Active Problem List   Diagnosis Date Noted   Senile purpura (HCC) 03/23/2022   Hypothyroid 03/21/2020   Osteoporosis 03/21/2020   Allergic rhinitis 02/13/2020   HTN (hypertension) 07/10/2019   Closed fracture of mandible with routine healing 06/09/2019   Closed fracture of right condylar process of mandible (HCC) 05/05/2019   Neck pain 04/07/2019   Hyperlipidemia 11/30/2016   Advance directive discussed with patient 11/30/2016   Microscopic hematuria    Diverticulosis     Past Surgical History:  Procedure Laterality Date   MANDIBLE SURGERY  04/2019    OB History   No obstetric history on file.      Home Medications    Prior to Admission medications   Medication Sig Start Date End Date Taking? Authorizing Provider  alendronate (FOSAMAX) 70 MG tablet TAKE 1 TABLET (70 MG TOTAL) BY MOUTH EVERY 7 DAYS WITH FULL GLASS WATER ON EMPTY STOMACH 01/15/22  Yes Johnson, Megan P, DO  aspirin EC 81 MG tablet Take 81 mg by mouth daily.   Yes [provider]  Biotin 40981 MCG TABS Take by mouth.   Yes [provider]  Calcium Carbonate  (CALCIUM 600 PO) Take 600 mg by mouth 2 (two) times daily.   Yes [provider]  Calcium Carbonate-Vitamin D 600-400 MG-UNIT tablet Take by mouth.   Yes [provider]  Flaxseed, Linseed, (FLAXSEED OIL) 1000 MG CAPS Take 1,000 mg by mouth daily.   Yes [provider]  levothyroxine (SYNTHROID) 25 MCG tablet TAKE 1 TABLET BY MOUTH EVERY DAY BEFORE BREAKFAST 03/24/22  Yes Johnson, Megan P, DO  lisinopril (ZESTRIL) 5 MG tablet Take 1 tablet (5 mg total) by mouth daily. 03/23/22  Yes Johnson, Megan P, DO  loratadine (CLARITIN) 10 MG tablet Take 10 mg by mouth daily.   Yes [provider]  Multiple Vitamins-Minerals (ONE DAILY CALCIUM/IRON) TABS Take by mouth.   Yes [provider]  Omega-3 Fatty Acids (FISH OIL) 1200 MG CAPS Take 1,200 mg by mouth daily.   Yes [provider]  Turmeric POWD Take by mouth.   Yes [provider]    Family History Family History  Problem Relation Age of Onset   Heart disease Mother        CABG   Hypertension Mother    Osteoporosis Mother    Thyroid disease Mother    Heart disease Father    Hypertension Father    Cancer Father        throat (smoker)   Hypertension Brother  Hypertension Son    COPD Neg Hx    Diabetes Neg Hx    Stroke Neg Hx    Breast cancer Neg Hx     Social History Social History   Tobacco Use   Smoking status: Never   Smokeless tobacco: Never  Vaping Use   Vaping Use: Never used  Substance Use Topics   Alcohol use: Yes    Alcohol/week: 7.0 standard drinks of alcohol    Types: 7 Glasses of wine per week    Comment: wine   Drug use: No     Allergies   Patient has no known allergies.   Review of Systems Review of Systems: negative unless otherwise stated in HPI.      Physical Exam Triage Vital Signs ED Triage Vitals  Enc Vitals Group     BP 06/06/22 1154 (!) 167/77     Pulse Rate 06/06/22 1154 64     Resp 06/06/22 1154 16     Temp 06/06/22 1154 98.1  F (36.7 C)     Temp Source 06/06/22 1154 Oral     SpO2 06/06/22 1154 98 %     Weight 06/06/22 1154 125 lb (56.7 kg)     Height 06/06/22 1154 5' (1.524 m)     Head Circumference --      Peak Flow --      Pain Score 06/06/22 1158 0     Pain Loc --      Pain Edu? --      Excl. in GC? --    No data found.  Updated Vital Signs BP (!) 167/77 (BP Location: Left Arm)   Pulse 64   Temp 98.1 F (36.7 C) (Oral)   Resp 16   Ht 5' (1.524 m)   Wt 56.7 kg   SpO2 98%   BMI 24.41 kg/m   Visual Acuity Right Eye Distance:   Left Eye Distance:   Bilateral Distance:    Right Eye Near:   Left Eye Near:    Bilateral Near:     Physical Exam GEN:     alert, non-toxic appearing female in no distress ***   HENT:  mucus membranes moist, oropharyngeal ***without lesions or ***erythema, no*** tonsillar hypertrophy or exudates, *** moderate erythematous edematous turbinates, ***clear nasal discharge, ***bilateral TM normal EYES:   pupils equal and reactive, ***no scleral injection or discharge NECK:  normal ROM, no ***lymphadenopathy, ***no meningismus   RESP:  no increased work of breathing, ***clear to auscultation bilaterally CVS:   regular rate ***and rhythm Skin:   warm and dry, no rash on visible skin***    UC Treatments / Results  Labs (all labs ordered are listed, but only abnormal results are displayed) Labs Reviewed - No data to display  EKG   Radiology No results found.  Procedures Procedures (including critical care time)  Medications Ordered in UC Medications - No data to display  Initial Impression / Assessment and Plan / UC Course  I have reviewed the triage vital signs and the nursing notes.  Pertinent labs & imaging results that were available during my care of the patient were reviewed by me and considered in my medical decision making (see chart for details).       Pt is a 72 y.o. female who presents for *** days of respiratory symptoms. Chelsea Hughes is  ***afebrile here without recent antipyretics. Satting well on room air. Overall pt is ***non-toxic appearing, well hydrated, without respiratory distress. Pulmonary exam ***is unremarkable.  COVID testing obtained ***and was negative. ***Pt to quarantine until COVID test results or longer if positive.  I will call patient with test results, if positive. History consistent with ***viral respiratory illness. Discussed symptomatic treatment.  Explained lack of efficacy of antibiotics in viral disease.  Typical duration of symptoms discussed.   Return and ED precautions given and voiced understanding. Discussed MDM, treatment plan and plan for follow-up with patient*** who agrees with plan.     Final Clinical Impressions(s) / UC Diagnoses   Final diagnoses:  None   Discharge Instructions   None    ED Prescriptions   None    PDMP not reviewed this encounter.

## 2022-06-06 NOTE — Discharge Instructions (Signed)
Stop by the pharmacy to pick up your prescriptions.  Follow up with your primary care provider as needed.  

## 2022-06-06 NOTE — ED Triage Notes (Signed)
Pt c/o nasal congestion & cough x2 wks. Has tried saline spray & sinus tabs w/o relief.

## 2022-09-04 ENCOUNTER — Encounter: Payer: Self-pay | Admitting: Family Medicine

## 2022-09-14 DIAGNOSIS — L815 Leukoderma, not elsewhere classified: Secondary | ICD-10-CM | POA: Diagnosis not present

## 2022-09-14 DIAGNOSIS — D692 Other nonthrombocytopenic purpura: Secondary | ICD-10-CM | POA: Diagnosis not present

## 2022-09-14 DIAGNOSIS — L814 Other melanin hyperpigmentation: Secondary | ICD-10-CM | POA: Diagnosis not present

## 2022-09-23 ENCOUNTER — Ambulatory Visit: Payer: Medicare PPO | Admitting: Family Medicine

## 2022-09-29 ENCOUNTER — Ambulatory Visit: Payer: Medicare PPO | Admitting: Family Medicine

## 2022-09-30 ENCOUNTER — Other Ambulatory Visit: Payer: Self-pay | Admitting: Family Medicine

## 2022-10-01 NOTE — Telephone Encounter (Signed)
Requested Prescriptions  Pending Prescriptions Disp Refills   lisinopril (ZESTRIL) 5 MG tablet [Pharmacy Med Name: LISINOPRIL 5 MG TABLET] 90 tablet 0    Sig: TAKE 1 TABLET (5 MG TOTAL) BY MOUTH DAILY.     Cardiovascular:  ACE Inhibitors Failed - 09/30/2022  1:34 AM      Failed - Cr in normal range and within 180 days    Creatinine, Ser  Date Value Ref Range Status  03/23/2022 0.79 0.57 - 1.00 mg/dL Final         Failed - K in normal range and within 180 days    Potassium  Date Value Ref Range Status  03/23/2022 4.8 3.5 - 5.2 mmol/L Final         Failed - Last BP in normal range    BP Readings from Last 1 Encounters:  06/06/22 (!) 167/77         Failed - Valid encounter within last 6 months    Recent Outpatient Visits           6 months ago Routine general medical examination at a health care facility   [facility] [place], [place] P, DO   1 year ago Acquired hypothyroidism   [facility] [place], [place] P, DO   1 year ago Routine general medical examination at a health care facility   [facility] [place], [place] P, DO   2 years ago Primary hypertension   [facility] [place], [place] P, DO   2 years ago Routine general medical examination at a health care facility   [facility] Dorcas Carrow, DO       Future Appointments             In 4 days Dorcas Carrow, DO [facility]            Passed - Patient is not pregnant

## 2022-10-05 ENCOUNTER — Encounter: Payer: Self-pay | Admitting: Family Medicine

## 2022-10-05 ENCOUNTER — Ambulatory Visit: Payer: Medicare PPO | Admitting: Family Medicine

## 2022-10-05 VITALS — BP 122/74 | HR 64 | Temp 97.8°F | Wt 125.2 lb

## 2022-10-05 DIAGNOSIS — D692 Other nonthrombocytopenic purpura: Secondary | ICD-10-CM

## 2022-10-05 DIAGNOSIS — I10 Essential (primary) hypertension: Secondary | ICD-10-CM

## 2022-10-05 DIAGNOSIS — E039 Hypothyroidism, unspecified: Secondary | ICD-10-CM | POA: Diagnosis not present

## 2022-10-05 DIAGNOSIS — E782 Mixed hyperlipidemia: Secondary | ICD-10-CM | POA: Diagnosis not present

## 2022-10-05 MED ORDER — LISINOPRIL 5 MG PO TABS: 5.0000 mg | ORAL_TABLET | Freq: Every day | ORAL | 1 refills | Status: DC

## 2022-10-05 MED ORDER — ALENDRONATE SODIUM 70 MG PO TABS: 70.0000 mg | ORAL_TABLET | ORAL | 11 refills | Status: DC

## 2022-10-05 NOTE — Assessment & Plan Note (Signed)
Reassured patient. Continue to monitor.

## 2022-10-05 NOTE — Assessment & Plan Note (Signed)
Rechecking labs today. Await results. Treat as needed.

## 2022-10-05 NOTE — Progress Notes (Signed)
BP 122/74   Pulse 64   Temp 97.8 F (36.6 C) (Oral)   Wt 125 lb 3.2 oz (56.8 kg)   SpO2 100%   BMI 24.45 kg/m    Subjective:    Patient ID: Chelsea Hughes, female    DOB: [birth year], 72 y.o.   MRN: [id number]  HPI: Chelsea Hughes is a 72 y.o. female  Chief Complaint  Patient presents with   Hypothyroidism   Hypertension   HYPOTHYROIDISM Thyroid control status:controlled Satisfied with current treatment? yes Medication side effects: no Medication compliance: excellent compliance Recent dose adjustment:no Fatigue: no Cold intolerance: no Heat intolerance: no Weight gain: no Weight loss: no Constipation: no Diarrhea/loose stools: no Palpitations: no Lower extremity edema: no Anxiety/depressed mood: no  HYPERTENSION / HYPERLIPIDEMIA Satisfied with current treatment? yes Duration of hypertension: chronic BP monitoring frequency: not checking BP medication side effects: no Past BP meds: lisinopril Duration of hyperlipidemia: chronic Cholesterol medication side effects: N/A Cholesterol supplements: fish oil Past cholesterol medications: none Medication compliance: excellent compliance Aspirin: yes Recent stressors: no Recurrent headaches: no Visual changes: no Palpitations: no Dyspnea: no Chest pain: no Lower extremity edema: no Dizzy/lightheaded: no  Relevant past medical, surgical, family and social history reviewed and updated as indicated. Interim medical history since our last visit reviewed. Allergies and medications reviewed and updated.  Review of Systems  Constitutional: Negative.   Respiratory: Negative.    Cardiovascular: Negative.   Musculoskeletal: Negative.   Neurological: Negative.   Psychiatric/Behavioral: Negative.      Per HPI unless specifically indicated above     Objective:    BP 122/74   Pulse 64   Temp 97.8 F (36.6 C) (Oral)   Wt 125 lb 3.2 oz (56.8 kg)   SpO2 100%   BMI 24.45 kg/m   Wt Readings from Last 3  Encounters:  10/05/22 125 lb 3.2 oz (56.8 kg)  06/06/22 125 lb (56.7 kg)  03/23/22 125 lb 9.6 oz (57 kg)    Physical Exam Vitals and nursing note reviewed.  Constitutional:      General: She is not in acute distress.    Appearance: Normal appearance. She is not ill-appearing, toxic-appearing or diaphoretic.  HENT:     Head: Normocephalic and atraumatic.     Right Ear: External ear normal.     Left Ear: External ear normal.     Nose: Nose normal.     Mouth/Throat:     Mouth: Mucous membranes are moist.     Pharynx: Oropharynx is clear.  Eyes:     General: No scleral icterus.       Right eye: No discharge.        Left eye: No discharge.     Extraocular Movements: Extraocular movements intact.     Conjunctiva/sclera: Conjunctivae normal.     Pupils: Pupils are equal, round, and reactive to light.  Cardiovascular:     Rate and Rhythm: Normal rate and regular rhythm.     Pulses: Normal pulses.     Heart sounds: Normal heart sounds. No murmur heard.    No friction rub. No gallop.  Pulmonary:     Effort: Pulmonary effort is normal. No respiratory distress.     Breath sounds: Normal breath sounds. No stridor. No wheezing, rhonchi or rales.  Chest:     Chest wall: No tenderness.  Musculoskeletal:        General: Normal range of motion.     Cervical back: Normal range of motion and  neck supple.  Skin:    General: Skin is warm and dry.     Capillary Refill: Capillary refill takes less than 2 seconds.     Coloration: Skin is not jaundiced or pale.     Findings: No bruising, erythema, lesion or rash.  Neurological:     General: No focal deficit present.     Mental Status: She is alert and oriented to person, place, and time. Mental status is at baseline.  Psychiatric:        Mood and Affect: Mood normal.        Behavior: Behavior normal.        Thought Content: Thought content normal.        Judgment: Judgment normal.     Results for orders placed or performed in visit on  03/23/22  Microscopic Examination   Urine  Result Value Ref Range   WBC, UA 0-5 0 - 5 /hpf   RBC, Urine 0-2 0 - 2 /hpf   Epithelial Cells (non renal) None seen 0 - 10 /hpf   Bacteria, UA None seen None seen/Few  CBC with Differential/Platelet  Result Value Ref Range   WBC 7.9 3.4 - 10.8 x10E3/uL   RBC 4.77 3.77 - 5.28 x10E6/uL   Hemoglobin 14.3 11.1 - 15.9 g/dL   Hematocrit 56.4 33.2 - 46.6 %   MCV 92 79 - 97 fL   MCH 30.0 26.6 - 33.0 pg   MCHC 32.6 31.5 - 35.7 g/dL   RDW 95.1 88.4 - 16.6 %   Platelets 286 150 - 450 x10E3/uL   Neutrophils 72 Not Estab. %   Lymphs 17 Not Estab. %   Monocytes 8 Not Estab. %   Eos 2 Not Estab. %   Basos 1 Not Estab. %   Neutrophils Absolute 5.7 1.4 - 7.0 x10E3/uL   Lymphocytes Absolute 1.4 0.7 - 3.1 x10E3/uL   Monocytes Absolute 0.6 0.1 - 0.9 x10E3/uL   EOS (ABSOLUTE) 0.1 0.0 - 0.4 x10E3/uL   Basophils Absolute 0.0 0.0 - 0.2 x10E3/uL   Immature Granulocytes 0 Not Estab. %   Immature Grans (Abs) 0.0 0.0 - 0.1 x10E3/uL  Comprehensive metabolic panel  Result Value Ref Range   Glucose 84 70 - 99 mg/dL   BUN 14 8 - 27 mg/dL   Creatinine, Ser 0.63 0.57 - 1.00 mg/dL   eGFR 79 >01 SW/FUX/3.23   BUN/Creatinine Ratio 18 12 - 28   Sodium 137 134 - 144 mmol/L   Potassium 4.8 3.5 - 5.2 mmol/L   Chloride 98 96 - 106 mmol/L   CO2 25 20 - 29 mmol/L   Calcium 9.6 8.7 - 10.3 mg/dL   Total Protein 6.6 6.0 - 8.5 g/dL   Albumin 4.4 3.8 - 4.8 g/dL   Globulin, Total 2.2 1.5 - 4.5 g/dL   Albumin/Globulin Ratio 2.0 1.2 - 2.2   Bilirubin Total 0.4 0.0 - 1.2 mg/dL   Alkaline Phosphatase 37 (L) 44 - 121 IU/L   AST 18 0 - 40 IU/L   ALT 11 0 - 32 IU/L  Lipid Panel w/o Chol/HDL Ratio  Result Value Ref Range   Cholesterol, Total 218 (H) 100 - 199 mg/dL   Triglycerides 557 (H) 0 - 149 mg/dL   HDL 53 >32 mg/dL   VLDL Cholesterol Cal 36 5 - 40 mg/dL   LDL Chol Calc (NIH) 202 (H) 0 - 99 mg/dL  Urinalysis, Routine w reflex microscopic  Result Value Ref Range    Specific Gravity,  UA 1.015 1.005 - 1.030   pH, UA 7.0 5.0 - 7.5   Color, UA Yellow Yellow   Appearance Ur Clear Clear   Leukocytes,UA Negative Negative   Protein,UA Negative Negative/Trace   Glucose, UA Negative Negative   Ketones, UA Negative Negative   RBC, UA Trace (A) Negative   Bilirubin, UA Negative Negative   Urobilinogen, Ur 0.2 0.2 - 1.0 mg/dL   Nitrite, UA Negative Negative   Microscopic Examination See below:   TSH  Result Value Ref Range   TSH 2.310 0.450 - 4.500 uIU/mL  Microalbumin, Urine Waived  Result Value Ref Range   Microalb, Ur Waived 10 0 - 19 mg/L   Creatinine, Urine Waived 50 10 - 300 mg/dL   Microalb/Creat Ratio <30 <30 mg/g      Assessment & Plan:   Problem List Items Addressed This Visit       Cardiovascular and Mediastinum   HTN (hypertension) - Primary    Under good control on current regimen. Continue current regimen. Continue to monitor. Call with any concerns. Refills given. Labs drawn today.       Relevant Medications   lisinopril (ZESTRIL) 5 MG tablet   Other Relevant Orders   CBC with Differential/Platelet   Comprehensive metabolic panel   Senile purpura (HCC)    Reassured patient. Continue to monitor.       Relevant Medications   lisinopril (ZESTRIL) 5 MG tablet   Other Relevant Orders   CBC with Differential/Platelet   Comprehensive metabolic panel     Endocrine   Hypothyroid    Rechecking labs today. Await results. Treat as needed.      Relevant Orders   CBC with Differential/Platelet   Comprehensive metabolic panel   TSH     Other   Hyperlipidemia    Rechecking labs today. Await results. Treat as needed.      Relevant Medications   lisinopril (ZESTRIL) 5 MG tablet   Other Relevant Orders   CBC with Differential/Platelet   Comprehensive metabolic panel   Lipid Panel w/o Chol/HDL Ratio     Follow up plan: Return in about 6 months (around 04/04/2023) for physical.

## 2022-10-05 NOTE — Assessment & Plan Note (Signed)
Under good control on current regimen. Continue current regimen. Continue to monitor. Call with any concerns. Refills given. Labs drawn today.   

## 2022-10-05 NOTE — Assessment & Plan Note (Signed)
Rechecking labs today. Await results. Treat as needed.  °

## 2022-10-06 LAB — COMPREHENSIVE METABOLIC PANEL
ALT: 10 IU/L (ref 0–32)
AST: 17 IU/L (ref 0–40)
Albumin: 4.3 g/dL (ref 3.8–4.8)
Alkaline Phosphatase: 36 IU/L — ABNORMAL LOW (ref 44–121)
BUN/Creatinine Ratio: 20 (ref 12–28)
BUN: 15 mg/dL (ref 8–27)
Bilirubin Total: 0.3 mg/dL (ref 0.0–1.2)
CO2: 24 mmol/L (ref 20–29)
Calcium: 10 mg/dL (ref 8.7–10.3)
Chloride: 99 mmol/L (ref 96–106)
Creatinine, Ser: 0.76 mg/dL (ref 0.57–1.00)
Globulin, Total: 2.2 g/dL (ref 1.5–4.5)
Glucose: 105 mg/dL — ABNORMAL HIGH (ref 70–99)
Potassium: 4.2 mmol/L (ref 3.5–5.2)
Sodium: 139 mmol/L (ref 134–144)
Total Protein: 6.5 g/dL (ref 6.0–8.5)
eGFR: 83 mL/min/{1.73_m2} (ref >59)

## 2022-10-06 LAB — CBC WITH DIFFERENTIAL/PLATELET
Basophils Absolute: 0.1 10*3/uL (ref 0.0–0.2)
Basos: 1 %
EOS (ABSOLUTE): 0.1 10*3/uL (ref 0.0–0.4)
Eos: 2 %
Hematocrit: 44.3 % (ref 34.0–46.6)
Hemoglobin: 14.5 g/dL (ref 11.1–15.9)
Immature Grans (Abs): 0 10*3/uL (ref 0.0–0.1)
Immature Granulocytes: 0 %
Lymphocytes Absolute: 1.4 10*3/uL (ref 0.7–3.1)
Lymphs: 19 %
MCH: 31 pg (ref 26.6–33.0)
MCHC: 32.7 g/dL (ref 31.5–35.7)
MCV: 95 fL (ref 79–97)
Monocytes Absolute: 0.5 10*3/uL (ref 0.1–0.9)
Monocytes: 7 %
Neutrophils Absolute: 5.3 10*3/uL (ref 1.4–7.0)
Neutrophils: 71 %
Platelets: 314 10*3/uL (ref 150–450)
RBC: 4.67 x10E6/uL (ref 3.77–5.28)
RDW: 13 % (ref 11.7–15.4)
WBC: 7.4 10*3/uL (ref 3.4–10.8)

## 2022-10-06 LAB — TSH: TSH: 2.3 u[IU]/mL (ref 0.450–4.500)

## 2022-10-06 LAB — LIPID PANEL W/O CHOL/HDL RATIO
Cholesterol, Total: 230 mg/dL — ABNORMAL HIGH (ref 100–199)
HDL: 54 mg/dL (ref >39)
LDL Chol Calc (NIH): 144 mg/dL — ABNORMAL HIGH (ref 0–99)
Triglycerides: 176 mg/dL — ABNORMAL HIGH (ref 0–149)
VLDL Cholesterol Cal: 32 mg/dL (ref 5–40)

## 2022-10-11 ENCOUNTER — Encounter: Payer: Self-pay | Admitting: Family Medicine

## 2023-01-18 ENCOUNTER — Other Ambulatory Visit: Payer: Self-pay | Admitting: Family Medicine

## 2023-01-22 NOTE — Telephone Encounter (Signed)
 Requested Prescriptions  Pending Prescriptions Disp Refills   levothyroxine  (SYNTHROID ) 25 MCG tablet [Pharmacy Med Name: LEVOTHYROXINE  25 MCG TABLET] 90 tablet 2    Sig: TAKE 1 TABLET BY MOUTH EVERY DAY BEFORE BREAKFAST     Endocrinology:  Hypothyroid Agents Passed - 01/22/2023  8:27 AM      Passed - TSH in normal range and within 360 days    TSH  Date Value Ref Range Status  10/05/2022 2.300 0.450 - 4.500 uIU/mL Final         Passed - Valid encounter within last 12 months    Recent Outpatient Visits           3 months ago Primary hypertension   [place] [facility] [place], [place] P, DO   10 months ago Routine general medical examination at a health care facility   [facility] [place], [place] P, DO   1 year ago Acquired hypothyroidism   [place] [facility] [place], [place] P, DO   1 year ago Routine general medical examination at a health care facility   [facility] [place], [place] P, DO   2 years ago Primary hypertension   [facility] Vicci Duwaine SQUIBB, DO       Future Appointments             In 2 months Vicci, Duwaine SQUIBB, DO [facility]

## 2023-03-04 ENCOUNTER — Ambulatory Visit: Payer: Medicare PPO | Admitting: Emergency Medicine

## 2023-03-04 VITALS — Ht 60.0 in | Wt 120.0 lb

## 2023-03-04 DIAGNOSIS — M810 Age-related osteoporosis without current pathological fracture: Secondary | ICD-10-CM

## 2023-03-04 DIAGNOSIS — Z0000 Encounter for general adult medical examination without abnormal findings: Secondary | ICD-10-CM | POA: Diagnosis not present

## 2023-03-04 DIAGNOSIS — Z780 Asymptomatic menopausal state: Secondary | ICD-10-CM

## 2023-03-04 DIAGNOSIS — Z1231 Encounter for screening mammogram for malignant neoplasm of breast: Secondary | ICD-10-CM

## 2023-03-04 NOTE — Patient Instructions (Addendum)
Chelsea Hughes , Thank you for taking time to come for your Medicare Wellness Visit. I appreciate your ongoing commitment to your health goals. Please review the following plan we discussed and let me know if I can assist you in the future.   Referrals/Orders/Follow-Ups/Clinician Recommendations: I have placed an order for a mammogram (due 04/15/23) and a bone density test (due 03/05/23). You may scheduled them together at [facility] [phone]  This is a list of the screening recommended for you and due dates:  Health Maintenance  Topic Date Due   COVID-19 Vaccine (6 - 2024-25 season) 09/20/2022   DEXA scan (bone density measurement)  03/05/2023   Mammogram  04/15/2023   Cologuard (Stool DNA test)  02/29/2024   Medicare Annual Wellness Visit  03/03/2024   DTaP/Tdap/Td vaccine (4 - Td or Tdap) 02/18/2031   Pneumonia Vaccine  Completed   Flu Shot  Completed   Hepatitis C Screening  Completed   Zoster (Shingles) Vaccine  Completed   HPV Vaccine  Aged Out    Advanced directives: (Provided) Advance directive discussed with you today. I have provided a copy for you to complete at home and have notarized. Once this is complete, please bring a copy in to our office so we can scan it into your chart.   Next Medicare Annual Wellness Visit scheduled for next year: Yes, 03/16/24 @ 8:40am (phone visit)

## 2023-03-04 NOTE — Progress Notes (Signed)
Subjective:   Chelsea Hughes is a 73 y.o. female who presents for Medicare Annual (Subsequent) preventive examination.  This patient declined Interactive audio and Acupuncturist. Therefore the visit was completed with audio only.   Visit Complete: Virtual I connected with  Chelsea Hughes on 03/04/23 by a audio enabled telemedicine application and verified that I am speaking with the correct person using two identifiers.  Patient Location: Home  Provider Location: Home Office  I discussed the limitations of evaluation and management by telemedicine. The patient expressed understanding and agreed to proceed.  Vital Signs: Because this visit was a virtual/telehealth visit, some criteria may be missing or patient reported. Any vitals not documented were not able to be obtained and vitals that have been documented are patient reported.   Cardiac Risk Factors include: advanced age (>3men, >39 women);hypertension;dyslipidemia     Objective:    Today's Vitals   03/04/23 0835  Weight: 120 lb (54.4 kg)  Height: 5' (1.524 m)   Body mass index is 23.44 kg/m.     03/04/2023    8:45 AM 02/23/2022    8:49 AM 12/25/2020    8:52 AM 12/25/2019    8:16 AM 05/05/2019    9:45 AM 12/05/2018    9:32 AM 12/02/2017    1:47 PM  Advanced Directives  Does Patient Have a Medical Advance Directive? No No No No No No No  Would patient like information on creating a medical advance directive? Yes (MAU/Ambulatory/Procedural Areas - Information given) No - Patient declined No - Patient declined  No - Patient declined  Yes (MAU/Ambulatory/Procedural Areas - Information given)    Current Medications (verified) Outpatient Encounter Medications as of 03/04/2023  Medication Sig   alendronate (FOSAMAX) 70 MG tablet Take 1 tablet (70 mg total) by mouth once a week. Take with a full glass of water on an empty stomach.   aspirin EC 81 MG tablet Take 81 mg by mouth daily.   Biotin 19147 MCG TABS Take  by mouth.   Calcium Carbonate (CALCIUM 600 PO) Take 600 mg by mouth 2 (two) times daily.   Calcium Carbonate-Vitamin D 600-400 MG-UNIT tablet Take by mouth.   Flaxseed, Linseed, (FLAXSEED OIL) 1000 MG CAPS Take 1,000 mg by mouth daily.   levothyroxine (SYNTHROID) 25 MCG tablet TAKE 1 TABLET BY MOUTH EVERY DAY BEFORE BREAKFAST   lisinopril (ZESTRIL) 5 MG tablet Take 1 tablet (5 mg total) by mouth daily.   loratadine (CLARITIN) 10 MG tablet Take 10 mg by mouth daily.   Multiple Vitamins-Minerals (ONE DAILY CALCIUM/IRON) TABS Take by mouth.   Omega-3 Fatty Acids (FISH OIL) 1200 MG CAPS Take 1,200 mg by mouth daily.   Turmeric POWD Take by mouth.   No facility-administered encounter medications on file as of 03/04/2023.    Allergies (verified) Patient has no known allergies.   History: Past Medical History:  Diagnosis Date   Atypical squamous cell changes of undetermined significance (ASCUS) on cervical cytology with positive high risk human papilloma virus (HPV)    Diverticulosis    on Colonoscopy 2007; CT scan 2014   Microscopic hematuria    evaluated by Urology in 2014, abd/pelvis CT scan Dec 2014   Osteoporosis    Past Surgical History:  Procedure Laterality Date   MANDIBLE SURGERY  04/2019   Family History  Problem Relation Age of Onset   Heart disease Mother        CABG   Hypertension Mother    Osteoporosis Mother  Thyroid disease Mother    Heart disease Father    Hypertension Father    Cancer Father        throat (smoker)   Hypertension Brother    Hypertension Son    COPD Neg Hx    Diabetes Neg Hx    Stroke Neg Hx    Breast cancer Neg Hx    Social History   Socioeconomic History   Marital status: Widowed    Spouse name: Not on file   Number of children: 2   Years of education: Not on file   Highest education level: Not on file  Occupational History   Not on file  Tobacco Use   Smoking status: Never   Smokeless tobacco: Never  Vaping Use   Vaping  status: Never Used  Substance and Sexual Activity   Alcohol use: Yes    Alcohol/week: 7.0 standard drinks of alcohol    Types: 7 Glasses of wine per week    Comment: 1 glass of wine per day   Drug use: No   Sexual activity: Not Currently  Other Topics Concern   Not on file  Social History Narrative   Not on file   Social Drivers of Health   Financial Resource Strain: Low Risk  (03/04/2023)   Overall Financial Resource Strain (CARDIA)    Difficulty of Paying Living Expenses: Not hard at all  Food Insecurity: No Food Insecurity (03/04/2023)   Hunger Vital Sign    Worried About Running Out of Food in the Last Year: Never true    Ran Out of Food in the Last Year: Never true  Transportation Needs: No Transportation Needs (03/04/2023)   PRAPARE - Administrator, Civil Service (Medical): No    Lack of Transportation (Non-Medical): No  Physical Activity: Sufficiently Active (03/04/2023)   Exercise Vital Sign    Days of Exercise per Week: 5 days    Minutes of Exercise per Session: 30 min  Stress: No Stress Concern Present (03/04/2023)   [employer] of Occupational Health - Occupational Stress Questionnaire    Feeling of Stress : Not at all  Social Connections: Moderately Integrated (03/04/2023)   Social Connection and Isolation Panel [NHANES]    Frequency of Communication with Friends and Family: More than three times a week    Frequency of Social Gatherings with Friends and Family: More than three times a week    Attends Religious Services: More than 4 times per year    Active Member of [employer] or Organizations: Yes    Attends Banker Meetings: More than 4 times per year    Marital Status: Widowed    Tobacco Counseling Counseling given: Not Answered   Clinical Intake:  Pre-visit preparation completed: Yes  Pain : No/denies pain     BMI - recorded: 23.44 Nutritional Status: BMI of 19-24  Normal Nutritional Risks: None Diabetes: No  How often do  you need to have someone help you when you read instructions, pamphlets, or other written materials from your doctor or pharmacy?: 1 - Never  Interpreter Needed?: No  Information entered by :: Tora Kindred, CMA   Activities of Daily Living    03/04/2023    8:37 AM  In your present state of health, do you have any difficulty performing the following activities:  Hearing? 0  Vision? 0  Difficulty concentrating or making decisions? 0  Walking or climbing stairs? 0  Dressing or bathing? 0  Doing errands, shopping? 0  Preparing Food and eating ? N  Using the Toilet? N  In the past six months, have you accidently leaked urine? N  Do you have problems with loss of bowel control? N  Managing your Medications? N  Managing your Finances? N  Housekeeping or managing your Housekeeping? N    Patient Care Team: Dorcas Carrow, DO as PCP - General (Family Medicine) Nancy Marus, MD (Dermatology) [facility], [facility])  Indicate any recent Medical Services you may have received from other than [facility] providers in the past year (date may be approximate).     Assessment:   This is a routine wellness examination for [place].  Hearing/Vision screen Hearing Screening - Comments:: Denies hearing loss Vision Screening - Comments:: Gets eye exams, Dr Clydene Pugh in [place]   Goals Addressed               This Visit's Progress     Patient Stated (pt-stated)        Lose 5 lbs      Depression Screen    03/04/2023    8:43 AM 10/05/2022    2:44 PM 03/23/2022   10:17 AM 02/23/2022    8:48 AM 02/17/2021    8:11 AM 12/25/2020    8:47 AM 02/13/2020    8:55 AM  PHQ 2/9 Scores  PHQ - 2 Score 0 0 0 0 0 0 0  PHQ- 9 Score  0 1 0 0      Fall Risk    03/04/2023    8:46 AM 10/05/2022    2:44 PM 03/23/2022   10:17 AM 02/23/2022    8:50 AM 02/17/2021    8:11 AM  Fall Risk   Falls in the past year? 0 0 0 0 0  Number falls in past yr: 0 0 0 0 0  Injury with Fall? 0 0 0 0 0  Risk  for fall due to : No Fall Risks No Fall Risks No Fall Risks No Fall Risks No Fall Risks  Follow up Falls prevention discussed;Falls evaluation completed Falls evaluation completed Falls evaluation completed Falls prevention discussed;Falls evaluation completed Falls evaluation completed    MEDICARE RISK AT HOME: Medicare Risk at Home Any stairs in or around the home?: Yes If so, are there any without handrails?: No Home free of loose throw rugs in walkways, pet beds, electrical cords, etc?: Yes Adequate lighting in your home to reduce risk of falls?: Yes Life alert?: No Use of a cane, walker or w/c?: No Grab bars in the bathroom?: Yes Shower chair or bench in shower?: No Elevated toilet seat or a handicapped toilet?: Yes  TIMED UP AND GO:  Was the test performed?  No    Cognitive Function:        03/04/2023    8:47 AM 02/23/2022    8:55 AM 12/25/2019    8:19 AM 12/02/2017    1:49 PM 11/30/2016    2:03 PM  6CIT Screen  What Year? 0 points 0 points 0 points 0 points 0 points  What month? 0 points 0 points 0 points 0 points 0 points  What time? 0 points 0 points 0 points 0 points 0 points  Count back from 20 0 points 0 points 0 points 0 points 0 points  Months in reverse 0 points 0 points 2 points 0 points   Repeat phrase 0 points 0 points 0 points 0 points 0 points  Total Score 0 points 0 points 2  points 0 points     Immunizations Immunization History  Administered Date(s) Administered   Fluad Quad(high Dose 65+) 10/02/2020, 09/26/2021   Influenza, High Dose Seasonal PF 11/06/2017, 09/19/2018, 10/20/2019, 10/30/2020, 10/18/2022   Influenza-Unspecified 09/20/2014, 10/16/2015, 11/09/2016   PFIZER Comirnaty(Gray Top)Covid-19 Tri-Sucrose Vaccine 04/30/2020   PFIZER(Purple Top)SARS-COV-2 Vaccination 02/28/2019, 03/21/2019, 11/02/2019   Pfizer Covid-19 Vaccine Bivalent Booster 26yrs & up 10/23/2020   Pneumococcal Conjugate-13 01/01/2014   Pneumococcal Polysaccharide-23  11/29/2015   Td 08/07/2004, 02/17/2021   Tdap 09/29/2010   Zoster Recombinant(Shingrix) 11/12/2018, 01/28/2019   Zoster, Live 02/20/2010    TDAP status: Up to date  Flu Vaccine status: Up to date  Pneumococcal vaccine status: Up to date  Covid-19 vaccine status: Completed vaccines  Qualifies for Shingles Vaccine? Yes   Zostavax completed Yes   Shingrix Completed?: Yes  Screening Tests Health Maintenance  Topic Date Due   COVID-19 Vaccine (6 - 2024-25 season) 09/20/2022   DEXA SCAN  03/05/2023   MAMMOGRAM  04/15/2023   Fecal DNA (Cologuard)  02/29/2024   Medicare Annual Wellness (AWV)  03/03/2024   DTaP/Tdap/Td (4 - Td or Tdap) 02/18/2031   Pneumonia Vaccine 38+ Years old  Completed   INFLUENZA VACCINE  Completed   Hepatitis C Screening  Completed   Zoster Vaccines- Shingrix  Completed   HPV VACCINES  Aged Out    Health Maintenance  Health Maintenance Due  Topic Date Due   COVID-19 Vaccine (6 - 2024-25 season) 09/20/2022    Colorectal cancer screening: Type of screening: Cologuard. Completed 02/28/21. Repeat every 3 years  Mammogram status: Ordered 03/04/23. Pt provided with contact info and advised to call to schedule appt.   Bone Density status: Ordered 03/04/23. Pt provided with contact info and advised to call to schedule appt.  Lung Cancer Screening: (Low Dose CT Chest recommended if Age 16-80 years, 20 pack-year currently smoking OR have quit w/in 15years.) does not qualify.   Lung Cancer Screening Referral: n/a  Additional Screening:  Hepatitis C Screening: does not qualify; Completed 11/29/15  Vision Screening: Recommended annual ophthalmology exams for early detection of glaucoma and other disorders of the eye.  Dental Screening: Recommended annual dental exams for proper oral hygiene    Community Resource Referral / Chronic Care Management: CRR required this visit?  No   CCM required this visit?  No     Plan:     I have personally reviewed  and noted the following in the patient's chart:   Medical and social history Use of alcohol, tobacco or illicit drugs  Current medications and supplements including opioid prescriptions. Patient is not currently taking opioid prescriptions. Functional ability and status Nutritional status Physical activity Advanced directives List of other physicians Hospitalizations, surgeries, and ER visits in previous 12 months Vitals Screenings to include cognitive, depression, and falls Referrals and appointments  In addition, I have reviewed and discussed with patient certain preventive protocols, quality metrics, and best practice recommendations. A written personalized care plan for preventive services as well as general preventive health recommendations were provided to patient.     Tora Kindred, CMA   03/04/2023   After Visit Summary: (Mail) Due to this being a telephonic visit, the after visit summary with patients personalized plan was offered to patient via mail   Nurse Notes:  Ordered MMG and DEXA scan

## 2023-04-05 ENCOUNTER — Encounter: Payer: Self-pay | Admitting: Family Medicine

## 2023-04-05 ENCOUNTER — Ambulatory Visit (INDEPENDENT_AMBULATORY_CARE_PROVIDER_SITE_OTHER): Payer: Medicare PPO | Admitting: Family Medicine

## 2023-04-05 VITALS — BP 119/68 | HR 61 | Ht 60.0 in | Wt 122.2 lb

## 2023-04-05 DIAGNOSIS — Z0000 Encounter for general adult medical examination without abnormal findings: Secondary | ICD-10-CM

## 2023-04-05 DIAGNOSIS — E782 Mixed hyperlipidemia: Secondary | ICD-10-CM | POA: Diagnosis not present

## 2023-04-05 DIAGNOSIS — I10 Essential (primary) hypertension: Secondary | ICD-10-CM

## 2023-04-05 DIAGNOSIS — E039 Hypothyroidism, unspecified: Secondary | ICD-10-CM | POA: Diagnosis not present

## 2023-04-05 DIAGNOSIS — D692 Other nonthrombocytopenic purpura: Secondary | ICD-10-CM | POA: Diagnosis not present

## 2023-04-05 LAB — MICROALBUMIN, URINE WAIVED
Creatinine, Urine Waived: 200 mg/dL (ref 10–300)
Microalb, Ur Waived: 150 mg/L — ABNORMAL HIGH (ref 0–19)

## 2023-04-05 MED ORDER — LISINOPRIL 5 MG PO TABS: 5.0000 mg | ORAL_TABLET | Freq: Every day | ORAL | 1 refills | Status: DC

## 2023-04-05 NOTE — Assessment & Plan Note (Signed)
 Rechecking labs today. Await results. Treat as needed.

## 2023-04-05 NOTE — Assessment & Plan Note (Signed)
 Reassured patient. Continue to monitor.

## 2023-04-05 NOTE — Progress Notes (Signed)
 BP 119/68 (BP Location: Left Arm, Patient Position: Sitting, Cuff Size: Large)   Pulse 61   Ht 5' (1.524 m)   Wt 122 lb 3.2 oz (55.4 kg)   SpO2 91%   BMI 23.87 kg/m    Subjective:    Patient ID: Chelsea Hughes, female    DOB: 1950-08-19, 73 y.o.   MRN: [id number]  HPI: Chelsea Hughes is a 73 y.o. female presenting on 04/05/2023 for comprehensive medical examination. Current medical complaints include:  HYPERTENSION / HYPERLIPIDEMIA Satisfied with current treatment? yes Duration of hypertension: chronic BP monitoring frequency: not checking BP medication side effects: no Past BP meds: lisinopril Duration of hyperlipidemia: chronic Cholesterol medication side effects: N/A Cholesterol supplements: flax seed oil, fish oil Past cholesterol medications: none Medication compliance: excellent compliance Aspirin: yes Recent stressors: no Recurrent headaches: no Visual changes: no Palpitations: no Dyspnea: no Chest pain: no Lower extremity edema: no Dizzy/lightheaded: no  HYPOTHYROIDISM Thyroid control status:controlled Satisfied with current treatment? yes Medication side effects: no Medication compliance: excellent compliance Recent dose adjustment:no Fatigue: no Cold intolerance: no Heat intolerance: no Weight gain: no Weight loss: no Constipation: no Diarrhea/loose stools: no Palpitations: no Lower extremity edema: no Anxiety/depressed mood: no  Menopausal Symptoms: no  Depression Screen done today and results listed below:     03/04/2023    8:43 AM 10/05/2022    2:44 PM 03/23/2022   10:17 AM 02/23/2022    8:48 AM 02/17/2021    8:11 AM  Depression screen PHQ 2/9  Decreased Interest 0 0 0 0 0  Down, Depressed, Hopeless 0 0 0 0 0  PHQ - 2 Score 0 0 0 0 0  Altered sleeping  0 1 0 0  Tired, decreased energy  0 0 0 0  Change in appetite  0 0 0 0  Feeling bad or failure about yourself   0 0 0 0  Trouble concentrating  0 0 0 0  Moving slowly or fidgety/restless   0 0 0 0  Suicidal thoughts  0 0 0 0  PHQ-9 Score  0 1 0 0  Difficult doing work/chores  Not difficult at all Not difficult at all Not difficult at all     Past Medical History:  Past Medical History:  Diagnosis Date   Atypical squamous cell changes of undetermined significance (ASCUS) on cervical cytology with positive high risk human papilloma virus (HPV)    Diverticulosis    on Colonoscopy 2007; CT scan 2014   Microscopic hematuria    evaluated by Urology in 2014, abd/pelvis CT scan Dec 2014   Osteoporosis     Surgical History:  Past Surgical History:  Procedure Laterality Date   MANDIBLE SURGERY  04/2019    Medications:  Current Outpatient Medications on File Prior to Visit  Medication Sig   alendronate (FOSAMAX) 70 MG tablet Take 1 tablet (70 mg total) by mouth once a week. Take with a full glass of water on an empty stomach.   aspirin EC 81 MG tablet Take 81 mg by mouth daily.   Biotin 84132 MCG TABS Take by mouth.   Calcium Carbonate (CALCIUM 600 PO) Take 600 mg by mouth 2 (two) times daily.   Calcium Carbonate-Vitamin D 600-400 MG-UNIT tablet Take by mouth.   Flaxseed, Linseed, (FLAXSEED OIL) 1000 MG CAPS Take 1,000 mg by mouth daily.   levothyroxine (SYNTHROID) 25 MCG tablet TAKE 1 TABLET BY MOUTH EVERY DAY BEFORE BREAKFAST   loratadine (CLARITIN) 10 MG tablet Take  10 mg by mouth daily.   Multiple Vitamins-Minerals (ONE DAILY CALCIUM/IRON) TABS Take by mouth.   Omega-3 Fatty Acids (FISH OIL) 1200 MG CAPS Take 1,200 mg by mouth daily.   Turmeric POWD Take by mouth.   No current facility-administered medications on file prior to visit.    Allergies:  No Known Allergies  Social History:  Social History   Socioeconomic History   Marital status: Widowed    Spouse name: Not on file   Number of children: 2   Years of education: Not on file   Highest education level: Not on file  Occupational History   Not on file  Tobacco Use   Smoking status: Never    Smokeless tobacco: Never  Vaping Use   Vaping status: Never Used  Substance and Sexual Activity   Alcohol use: Yes    Alcohol/week: 7.0 standard drinks of alcohol    Types: 7 Glasses of wine per week    Comment: 1 glass of wine per day   Drug use: No   Sexual activity: Not Currently  Other Topics Concern   Not on file  Social History Narrative   Not on file   Social Drivers of Health   Financial Resource Strain: Low Risk  (03/04/2023)   Overall Financial Resource Strain (CARDIA)    Difficulty of Paying Living Expenses: Not hard at all  Food Insecurity: No Food Insecurity (03/04/2023)   Hunger Vital Sign    Worried About Running Out of Food in the Last Year: Never true    Ran Out of Food in the Last Year: Never true  Transportation Needs: No Transportation Needs (03/04/2023)   PRAPARE - Administrator, Civil Service (Medical): No    Lack of Transportation (Non-Medical): No  Physical Activity: Sufficiently Active (03/04/2023)   Exercise Vital Sign    Days of Exercise per Week: 5 days    Minutes of Exercise per Session: 30 min  Stress: No Stress Concern Present (03/04/2023)   [employer] of Occupational Health - Occupational Stress Questionnaire    Feeling of Stress : Not at all  Social Connections: Moderately Integrated (03/04/2023)   Social Connection and Isolation Panel [NHANES]    Frequency of Communication with Friends and Family: More than three times a week    Frequency of Social Gatherings with Friends and Family: More than three times a week    Attends Religious Services: More than 4 times per year    Active Member of [employer] or Organizations: Yes    Attends Banker Meetings: More than 4 times per year    Marital Status: Widowed  Intimate Partner Violence: Not At Risk (03/04/2023)   Humiliation, Afraid, Rape, and Kick questionnaire    Fear of Current or Ex-Partner: No    Emotionally Abused: No    Physically Abused: No    Sexually Abused: No    Social History   Tobacco Use  Smoking Status Never  Smokeless Tobacco Never   Social History   Substance and Sexual Activity  Alcohol Use Yes   Alcohol/week: 7.0 standard drinks of alcohol   Types: 7 Glasses of wine per week   Comment: 1 glass of wine per day    Family History:  Family History  Problem Relation Age of Onset   Heart disease Mother        CABG   Hypertension Mother    Osteoporosis Mother    Thyroid disease Mother    Heart disease Father  Hypertension Father    Cancer Father        throat (smoker)   Hypertension Brother    Hypertension Son    COPD Neg Hx    Diabetes Neg Hx    Stroke Neg Hx    Breast cancer Neg Hx     Past medical history, surgical history, medications, allergies, family history and social history reviewed with patient today and changes made to appropriate areas of the chart.   Review of Systems  Constitutional: Negative.   HENT: Negative.    Eyes: Negative.   Respiratory: Negative.    Cardiovascular: Negative.   Gastrointestinal: Negative.   Genitourinary: Negative.   Musculoskeletal:  Positive for myalgias. Negative for back pain, falls, joint pain and neck pain.  Skin: Negative.   Neurological: Negative.   Endo/Heme/Allergies:  Positive for environmental allergies. Negative for polydipsia. Bruises/bleeds easily.  Psychiatric/Behavioral: Negative.     All other ROS negative except what is listed above and in the HPI.      Objective:    BP 119/68 (BP Location: Left Arm, Patient Position: Sitting, Cuff Size: Large)   Pulse 61   Ht 5' (1.524 m)   Wt 122 lb 3.2 oz (55.4 kg)   SpO2 91%   BMI 23.87 kg/m   Wt Readings from Last 3 Encounters:  04/05/23 122 lb 3.2 oz (55.4 kg)  03/04/23 120 lb (54.4 kg)  10/05/22 125 lb 3.2 oz (56.8 kg)    Physical Exam Vitals and nursing note reviewed.  Constitutional:      General: She is not in acute distress.    Appearance: Normal appearance. She is not ill-appearing,  toxic-appearing or diaphoretic.  HENT:     Head: Normocephalic and atraumatic.     Right Ear: Tympanic membrane, ear canal and external ear normal. There is no impacted cerumen.     Left Ear: Tympanic membrane, ear canal and external ear normal. There is no impacted cerumen.     Nose: Nose normal. No congestion or rhinorrhea.     Mouth/Throat:     Mouth: Mucous membranes are moist.     Pharynx: Oropharynx is clear. No oropharyngeal exudate or posterior oropharyngeal erythema.  Eyes:     General: No scleral icterus.       Right eye: No discharge.        Left eye: No discharge.     Extraocular Movements: Extraocular movements intact.     Conjunctiva/sclera: Conjunctivae normal.     Pupils: Pupils are equal, round, and reactive to light.  Neck:     Vascular: No carotid bruit.  Cardiovascular:     Rate and Rhythm: Normal rate and regular rhythm.     Pulses: Normal pulses.     Heart sounds: No murmur heard.    No friction rub. No gallop.  Pulmonary:     Effort: Pulmonary effort is normal. No respiratory distress.     Breath sounds: Normal breath sounds. No stridor. No wheezing, rhonchi or rales.  Chest:     Chest wall: No tenderness.  Abdominal:     General: Abdomen is flat. Bowel sounds are normal. There is no distension.     Palpations: Abdomen is soft. There is no mass.     Tenderness: There is no abdominal tenderness. There is no right CVA tenderness, left CVA tenderness, guarding or rebound.     Hernia: No hernia is present.  Genitourinary:    Comments: Breast and pelvic exams deferred with shared decision making Musculoskeletal:  General: No swelling, tenderness, deformity or signs of injury. Normal range of motion.     Cervical back: Normal range of motion and neck supple. No rigidity. No muscular tenderness.     Right lower leg: No edema.     Left lower leg: No edema.  Lymphadenopathy:     Cervical: No cervical adenopathy.  Skin:    General: Skin is warm and dry.      Capillary Refill: Capillary refill takes less than 2 seconds.     Coloration: Skin is not jaundiced or pale.     Findings: No bruising, erythema, lesion or rash.  Neurological:     General: No focal deficit present.     Mental Status: She is alert and oriented to person, place, and time. Mental status is at baseline.     Cranial Nerves: No cranial nerve deficit.     Sensory: No sensory deficit.     Motor: No weakness.     Coordination: Coordination normal.     Gait: Gait normal.     Deep Tendon Reflexes: Reflexes normal.  Psychiatric:        Mood and Affect: Mood normal.        Behavior: Behavior normal.        Thought Content: Thought content normal.        Judgment: Judgment normal.     Results for orders placed or performed in visit on 10/05/22  CBC with Differential/Platelet   Collection Time: 10/05/22  3:03 PM  Result Value Ref Range   WBC 7.4 3.4 - 10.8 x10E3/uL   RBC 4.67 3.77 - 5.28 x10E6/uL   Hemoglobin 14.5 11.1 - 15.9 g/dL   Hematocrit 47.8 29.5 - 46.6 %   MCV 95 79 - 97 fL   MCH 31.0 26.6 - 33.0 pg   MCHC 32.7 31.5 - 35.7 g/dL   RDW 62.1 30.8 - 65.7 %   Platelets 314 150 - 450 x10E3/uL   Neutrophils 71 Not Estab. %   Lymphs 19 Not Estab. %   Monocytes 7 Not Estab. %   Eos 2 Not Estab. %   Basos 1 Not Estab. %   Neutrophils Absolute 5.3 1.4 - 7.0 x10E3/uL   Lymphocytes Absolute 1.4 0.7 - 3.1 x10E3/uL   Monocytes Absolute 0.5 0.1 - 0.9 x10E3/uL   EOS (ABSOLUTE) 0.1 0.0 - 0.4 x10E3/uL   Basophils Absolute 0.1 0.0 - 0.2 x10E3/uL   Immature Granulocytes 0 Not Estab. %   Immature Grans (Abs) 0.0 0.0 - 0.1 x10E3/uL  Comprehensive metabolic panel   Collection Time: 10/05/22  3:03 PM  Result Value Ref Range   Glucose 105 (H) 70 - 99 mg/dL   BUN 15 8 - 27 mg/dL   Creatinine, Ser 8.46 0.57 - 1.00 mg/dL   eGFR 83 >96 EX/BMW/4.13   BUN/Creatinine Ratio 20 12 - 28   Sodium 139 134 - 144 mmol/L   Potassium 4.2 3.5 - 5.2 mmol/L   Chloride 99 96 - 106 mmol/L    CO2 24 20 - 29 mmol/L   Calcium 10.0 8.7 - 10.3 mg/dL   Total Protein 6.5 6.0 - 8.5 g/dL   Albumin 4.3 3.8 - 4.8 g/dL   Globulin, Total 2.2 1.5 - 4.5 g/dL   Bilirubin Total 0.3 0.0 - 1.2 mg/dL   Alkaline Phosphatase 36 (L) 44 - 121 IU/L   AST 17 0 - 40 IU/L   ALT 10 0 - 32 IU/L  Lipid Panel w/o Chol/HDL Ratio  Collection Time: 10/05/22  3:03 PM  Result Value Ref Range   Cholesterol, Total 230 (H) 100 - 199 mg/dL   Triglycerides 528 (H) 0 - 149 mg/dL   HDL 54 >41 mg/dL   VLDL Cholesterol Cal 32 5 - 40 mg/dL   LDL Chol Calc (NIH) 324 (H) 0 - 99 mg/dL  TSH   Collection Time: 10/05/22  3:03 PM  Result Value Ref Range   TSH 2.300 0.450 - 4.500 uIU/mL      Assessment & Plan:   Problem List Items Addressed This Visit       Cardiovascular and Mediastinum   HTN (hypertension)   Under good control on current regimen. Continue current regimen. Continue to monitor. Call with any concerns. Refills given. Labs drawn today.       Relevant Medications   lisinopril (ZESTRIL) 5 MG tablet   Other Relevant Orders   Microalbumin, Urine Waived   CBC with Differential/Platelet   Comprehensive metabolic panel   Senile purpura (HCC)   Reassured patient. Continue to monitor.       Relevant Medications   lisinopril (ZESTRIL) 5 MG tablet   Other Relevant Orders   CBC with Differential/Platelet   Comprehensive metabolic panel     Endocrine   Hypothyroid   Rechecking labs today. Await results. Treat as needed.       Relevant Orders   CBC with Differential/Platelet   Comprehensive metabolic panel   TSH     Other   Hyperlipidemia   Rechecking labs today. Await results. Treat as needed.       Relevant Medications   lisinopril (ZESTRIL) 5 MG tablet   Other Relevant Orders   CBC with Differential/Platelet   Comprehensive metabolic panel   Lipid Panel w/o Chol/HDL Ratio   Other Visit Diagnoses       Routine general medical examination at a health care facility    -  Primary    Vaccines up to date. Screening labs checked today. Mammo and DEXA scheduled. Cologuard up to date. Continue diet and exercise. Call with any concerns.        Follow up plan: Return in about 6 months (around 10/06/2023).   LABORATORY TESTING:  - Pap smear: not applicable  IMMUNIZATIONS:   - Tdap: Tetanus vaccination status reviewed: last tetanus booster within 10 years. - Influenza: Up to date - Pneumovax: Up to date - Prevnar: Up to date - COVID: Up to date - HPV: Not applicable - Shingrix vaccine: Up to date  SCREENING: -Mammogram:  Scheduled   - Colonoscopy: Up to date  - Bone Density: Scheduled   PATIENT COUNSELING:   Advised to take 1 mg of folate supplement per day if capable of pregnancy.   Sexuality: Discussed sexually transmitted diseases, partner selection, use of condoms, avoidance of unintended pregnancy  and contraceptive alternatives.   Advised to avoid cigarette smoking.  I discussed with the patient that most people either abstain from alcohol or drink within safe limits (<=14/week and <=4 drinks/occasion for males, <=7/weeks and <= 3 drinks/occasion for females) and that the risk for alcohol disorders and other health effects rises proportionally with the number of drinks per week and how often a drinker exceeds daily limits.  Discussed cessation/primary prevention of drug use and availability of treatment for abuse.   Diet: Encouraged to adjust caloric intake to maintain  or achieve ideal body weight, to reduce intake of dietary saturated fat and total fat, to limit sodium intake by avoiding high sodium  foods and not adding table salt, and to maintain adequate dietary potassium and calcium preferably from fresh fruits, vegetables, and low-fat dairy products.    stressed the importance of regular exercise  Injury prevention: Discussed safety belts, safety helmets, smoke detector, smoking near bedding or upholstery.   Dental health: Discussed importance of  regular tooth brushing, flossing, and dental visits.    NEXT PREVENTATIVE PHYSICAL DUE IN 1 YEAR. Return in about 6 months (around 10/06/2023).

## 2023-04-05 NOTE — Assessment & Plan Note (Signed)
 Under good control on current regimen. Continue current regimen. Continue to monitor. Call with any concerns. Refills given. Labs drawn today.

## 2023-04-06 ENCOUNTER — Encounter: Payer: Self-pay | Admitting: Family Medicine

## 2023-04-06 LAB — CBC WITH DIFFERENTIAL/PLATELET
Basophils Absolute: 0 10*3/uL (ref 0.0–0.2)
Basos: 1 %
EOS (ABSOLUTE): 0.1 10*3/uL (ref 0.0–0.4)
Eos: 1 %
Hematocrit: 45.7 % (ref 34.0–46.6)
Hemoglobin: 14.9 g/dL (ref 11.1–15.9)
Immature Grans (Abs): 0 10*3/uL (ref 0.0–0.1)
Immature Granulocytes: 0 %
Lymphocytes Absolute: 1.3 10*3/uL (ref 0.7–3.1)
Lymphs: 22 %
MCH: 30.4 pg (ref 26.6–33.0)
MCHC: 32.6 g/dL (ref 31.5–35.7)
MCV: 93 fL (ref 79–97)
Monocytes Absolute: 0.5 10*3/uL (ref 0.1–0.9)
Monocytes: 8 %
Neutrophils Absolute: 4 10*3/uL (ref 1.4–7.0)
Neutrophils: 68 %
Platelets: 286 10*3/uL (ref 150–450)
RBC: 4.9 x10E6/uL (ref 3.77–5.28)
RDW: 12.8 % (ref 11.7–15.4)
WBC: 5.9 10*3/uL (ref 3.4–10.8)

## 2023-04-06 LAB — COMPREHENSIVE METABOLIC PANEL
ALT: 12 IU/L (ref 0–32)
AST: 16 IU/L (ref 0–40)
Albumin: 4.4 g/dL (ref 3.8–4.8)
Alkaline Phosphatase: 35 IU/L — ABNORMAL LOW (ref 44–121)
BUN/Creatinine Ratio: 14 (ref 12–28)
BUN: 11 mg/dL (ref 8–27)
Bilirubin Total: 0.5 mg/dL (ref 0.0–1.2)
CO2: 23 mmol/L (ref 20–29)
Calcium: 9.4 mg/dL (ref 8.7–10.3)
Chloride: 101 mmol/L (ref 96–106)
Creatinine, Ser: 0.78 mg/dL (ref 0.57–1.00)
Globulin, Total: 2.3 g/dL (ref 1.5–4.5)
Glucose: 87 mg/dL (ref 70–99)
Potassium: 4.4 mmol/L (ref 3.5–5.2)
Sodium: 139 mmol/L (ref 134–144)
Total Protein: 6.7 g/dL (ref 6.0–8.5)
eGFR: 80 mL/min/{1.73_m2} (ref >59)

## 2023-04-06 LAB — LIPID PANEL W/O CHOL/HDL RATIO
Cholesterol, Total: 217 mg/dL — ABNORMAL HIGH (ref 100–199)
HDL: 57 mg/dL (ref >39)
LDL Chol Calc (NIH): 138 mg/dL — ABNORMAL HIGH (ref 0–99)
Triglycerides: 123 mg/dL (ref 0–149)
VLDL Cholesterol Cal: 22 mg/dL (ref 5–40)

## 2023-04-06 LAB — TSH: TSH: 2.49 u[IU]/mL (ref 0.450–4.500)

## 2023-04-06 NOTE — Progress Notes (Signed)
Results printed and mailed.   

## 2023-04-08 NOTE — Progress Notes (Signed)
 Letter printed and mailed.

## 2023-04-19 ENCOUNTER — Encounter: Payer: Self-pay | Admitting: Family Medicine

## 2023-04-19 ENCOUNTER — Ambulatory Visit
Admission: RE | Admit: 2023-04-19 | Discharge: 2023-04-19 | Disposition: A | Discharge status: Home / Self Care | Payer: Medicare PPO | Source: Ambulatory Visit | Attending: Family Medicine | Admitting: Family Medicine

## 2023-04-19 ENCOUNTER — Ambulatory Visit
Admission: RE | Admit: 2023-04-19 | Discharge: 2023-04-19 | Disposition: A | Discharge status: Home / Self Care | Payer: Medicare PPO | Source: Ambulatory Visit | Attending: Family Medicine

## 2023-04-19 DIAGNOSIS — M810 Age-related osteoporosis without current pathological fracture: Secondary | ICD-10-CM | POA: Insufficient documentation

## 2023-04-19 DIAGNOSIS — Z1231 Encounter for screening mammogram for malignant neoplasm of breast: Secondary | ICD-10-CM | POA: Diagnosis present

## 2023-04-19 DIAGNOSIS — Z780 Asymptomatic menopausal state: Secondary | ICD-10-CM | POA: Insufficient documentation

## 2023-04-19 NOTE — Progress Notes (Signed)
 Results have been printed and mailed.

## 2023-04-20 ENCOUNTER — Encounter: Payer: Self-pay | Admitting: Family Medicine

## 2023-04-21 NOTE — Progress Notes (Signed)
 Letter printed and mailed.

## 2023-04-21 NOTE — Progress Notes (Signed)
 Results have been printed and mailed.

## 2023-09-13 DIAGNOSIS — L815 Leukoderma, not elsewhere classified: Secondary | ICD-10-CM | POA: Diagnosis not present

## 2023-09-13 DIAGNOSIS — Z7189 Other specified counseling: Secondary | ICD-10-CM | POA: Diagnosis not present

## 2023-09-13 DIAGNOSIS — D692 Other nonthrombocytopenic purpura: Secondary | ICD-10-CM | POA: Diagnosis not present

## 2023-09-13 DIAGNOSIS — L814 Other melanin hyperpigmentation: Secondary | ICD-10-CM | POA: Diagnosis not present

## 2023-09-13 DIAGNOSIS — D1801 Hemangioma of skin and subcutaneous tissue: Secondary | ICD-10-CM | POA: Diagnosis not present

## 2023-10-06 ENCOUNTER — Encounter: Payer: Self-pay | Admitting: Family Medicine

## 2023-10-06 ENCOUNTER — Ambulatory Visit: Admitting: Family Medicine

## 2023-10-06 VITALS — BP 162/90 | HR 58 | Temp 97.4°F | Ht 60.0 in | Wt 122.0 lb

## 2023-10-06 DIAGNOSIS — E782 Mixed hyperlipidemia: Secondary | ICD-10-CM | POA: Diagnosis not present

## 2023-10-06 DIAGNOSIS — I10 Essential (primary) hypertension: Secondary | ICD-10-CM | POA: Diagnosis not present

## 2023-10-06 DIAGNOSIS — E039 Hypothyroidism, unspecified: Secondary | ICD-10-CM

## 2023-10-06 MED ORDER — ALENDRONATE SODIUM 70 MG PO TABS: 70.0000 mg | ORAL_TABLET | ORAL | 11 refills | Status: AC

## 2023-10-06 MED ORDER — LISINOPRIL 5 MG PO TABS: 5.0000 mg | ORAL_TABLET | Freq: Every day | ORAL | 1 refills | Status: AC

## 2023-10-06 NOTE — Assessment & Plan Note (Signed)
 Rechecking labs today. Await results. Treat as needed.

## 2023-10-06 NOTE — Progress Notes (Signed)
 BP (!) 162/90 Comment: did not take her medicine today  Pulse (!) 58   Temp (!) 97.4 F (36.3 C) (Oral)   Ht 5' (1.524 m)   Wt 122 lb (55.3 kg)   SpO2 97%   BMI 23.83 kg/m    Subjective:    Patient ID: Chelsea Hughes, female    DOB: [birth year], 73 y.o.   MRN: [id number]  HPI: Chelsea Hughes is a 73 y.o. female  Chief Complaint  Patient presents with   Hypertension   Hypothyroidism   Hyperlipidemia   HYPERTENSION / HYPERLIPIDEMIA Satisfied with current treatment? yes Duration of hypertension: chronic BP monitoring frequency: not checking BP medication side effects: no Past BP meds: lisinopril  Duration of hyperlipidemia: chronic Cholesterol medication side effects: no Cholesterol supplements: none Past cholesterol medications: none Medication compliance: good compliance Aspirin: yes Recent stressors: no Recurrent headaches: no Visual changes: no Palpitations: no Dyspnea: no Chest pain: no Lower extremity edema: no Dizzy/lightheaded: no  HYPOTHYROIDISM Thyroid  control status:controlled Satisfied with current treatment? yes Medication side effects: no Medication compliance: excellent compliance Recent dose adjustment:no Fatigue: no Cold intolerance: no Heat intolerance: no Weight gain: no Weight loss: no Constipation: no Diarrhea/loose stools: no Palpitations: no Lower extremity edema: no Anxiety/depressed mood: no  Relevant past medical, surgical, family and social history reviewed and updated as indicated. Interim medical history since our last visit reviewed. Allergies and medications reviewed and updated.  Review of Systems  Constitutional: Negative.   Respiratory: Negative.    Cardiovascular: Negative.   Gastrointestinal: Negative.   Musculoskeletal: Negative.   Neurological: Negative.   Psychiatric/Behavioral: Negative.      Per HPI unless specifically indicated above     Objective:    BP (!) 162/90 Comment: did not take her medicine  today  Pulse (!) 58   Temp (!) 97.4 F (36.3 C) (Oral)   Ht 5' (1.524 m)   Wt 122 lb (55.3 kg)   SpO2 97%   BMI 23.83 kg/m   Wt Readings from Last 3 Encounters:  10/06/23 122 lb (55.3 kg)  04/05/23 122 lb 3.2 oz (55.4 kg)  03/04/23 120 lb (54.4 kg)    Physical Exam Vitals and nursing note reviewed.  Constitutional:      General: She is not in acute distress.    Appearance: Normal appearance. She is not ill-appearing, toxic-appearing or diaphoretic.  HENT:     Head: Normocephalic and atraumatic.     Right Ear: External ear normal.     Left Ear: External ear normal.     Nose: Nose normal.     Mouth/Throat:     Mouth: Mucous membranes are moist.     Pharynx: Oropharynx is clear.  Eyes:     General: No scleral icterus.       Right eye: No discharge.        Left eye: No discharge.     Extraocular Movements: Extraocular movements intact.     Conjunctiva/sclera: Conjunctivae normal.     Pupils: Pupils are equal, round, and reactive to light.  Cardiovascular:     Rate and Rhythm: Normal rate and regular rhythm.     Pulses: Normal pulses.     Heart sounds: Normal heart sounds. No murmur heard.    No friction rub. No gallop.  Pulmonary:     Effort: Pulmonary effort is normal. No respiratory distress.     Breath sounds: Normal breath sounds. No stridor. No wheezing, rhonchi or rales.  Chest:  Chest wall: No tenderness.  Musculoskeletal:        General: Normal range of motion.     Cervical back: Normal range of motion and neck supple.  Skin:    General: Skin is warm and dry.     Capillary Refill: Capillary refill takes less than 2 seconds.     Coloration: Skin is not jaundiced or pale.     Findings: No bruising, erythema, lesion or rash.  Neurological:     General: No focal deficit present.     Mental Status: She is alert and oriented to person, place, and time. Mental status is at baseline.  Psychiatric:        Mood and Affect: Mood normal.        Behavior: Behavior  normal.        Thought Content: Thought content normal.        Judgment: Judgment normal.     Results for orders placed or performed in visit on 04/05/23  Microalbumin, Urine Waived   Collection Time: 04/05/23 10:39 AM  Result Value Ref Range   Microalb, Ur Waived 150 (H) 0 - 19 mg/L   Creatinine, Urine Waived 200 10 - 300 mg/dL   Microalb/Creat Ratio 30-300 (H) <30 mg/g  CBC with Differential/Platelet   Collection Time: 04/05/23 10:40 AM  Result Value Ref Range   WBC 5.9 3.4 - 10.8 x10E3/uL   RBC 4.90 3.77 - 5.28 x10E6/uL   Hemoglobin 14.9 11.1 - 15.9 g/dL   Hematocrit 54.2 65.9 - 46.6 %   MCV 93 79 - 97 fL   MCH 30.4 26.6 - 33.0 pg   MCHC 32.6 31.5 - 35.7 g/dL   RDW 87.1 88.2 - 84.5 %   Platelets 286 150 - 450 x10E3/uL   Neutrophils 68 Not Estab. %   Lymphs 22 Not Estab. %   Monocytes 8 Not Estab. %   Eos 1 Not Estab. %   Basos 1 Not Estab. %   Neutrophils Absolute 4.0 1.4 - 7.0 x10E3/uL   Lymphocytes Absolute 1.3 0.7 - 3.1 x10E3/uL   Monocytes Absolute 0.5 0.1 - 0.9 x10E3/uL   EOS (ABSOLUTE) 0.1 0.0 - 0.4 x10E3/uL   Basophils Absolute 0.0 0.0 - 0.2 x10E3/uL   Immature Granulocytes 0 Not Estab. %   Immature Grans (Abs) 0.0 0.0 - 0.1 x10E3/uL  Comprehensive metabolic panel   Collection Time: 04/05/23 10:40 AM  Result Value Ref Range   Glucose 87 70 - 99 mg/dL   BUN 11 8 - 27 mg/dL   Creatinine, Ser 9.21 0.57 - 1.00 mg/dL   eGFR 80 >40 fO/fpw/8.26   BUN/Creatinine Ratio 14 12 - 28   Sodium 139 134 - 144 mmol/L   Potassium 4.4 3.5 - 5.2 mmol/L   Chloride 101 96 - 106 mmol/L   CO2 23 20 - 29 mmol/L   Calcium 9.4 8.7 - 10.3 mg/dL   Total Protein 6.7 6.0 - 8.5 g/dL   Albumin 4.4 3.8 - 4.8 g/dL   Globulin, Total 2.3 1.5 - 4.5 g/dL   Bilirubin Total 0.5 0.0 - 1.2 mg/dL   Alkaline Phosphatase 35 (L) 44 - 121 IU/L   AST 16 0 - 40 IU/L   ALT 12 0 - 32 IU/L  Lipid Panel w/o Chol/HDL Ratio   Collection Time: 04/05/23 10:40 AM  Result Value Ref Range   Cholesterol,  Total 217 (H) 100 - 199 mg/dL   Triglycerides 876 0 - 149 mg/dL   HDL 57 >60 mg/dL  VLDL Cholesterol Cal 22 5 - 40 mg/dL   LDL Chol Calc (NIH) 861 (H) 0 - 99 mg/dL  TSH   Collection Time: 04/05/23 10:40 AM  Result Value Ref Range   TSH 2.490 0.450 - 4.500 uIU/mL      Assessment & Plan:   Problem List Items Addressed This Visit       Cardiovascular and Mediastinum   HTN (hypertension)   Did not take her medicine today- running high. Usually good at home. Continue current regimen. Continue to monitor. Call with any concerns.       Relevant Medications   lisinopril  (ZESTRIL ) 5 MG tablet   Other Relevant Orders   CBC with Differential/Platelet   Comprehensive metabolic panel with GFR     Endocrine   Hypothyroid   Rechecking labs today. Await results. Treat as needed.       Relevant Orders   CBC with Differential/Platelet   Comprehensive metabolic panel with GFR   TSH     Other   Hyperlipidemia - Primary   Rechecking labs today. Await results. Treat as needed.       Relevant Medications   lisinopril  (ZESTRIL ) 5 MG tablet   Other Relevant Orders   CBC with Differential/Platelet   Comprehensive metabolic panel with GFR   Lipid Panel w/o Chol/HDL Ratio     Follow up plan: Return in about 6 months (around 04/04/2024) for physical.

## 2023-10-06 NOTE — Assessment & Plan Note (Signed)
 Did not take her medicine today- running high. Usually good at home. Continue current regimen. Continue to monitor. Call with any concerns.

## 2023-10-07 ENCOUNTER — Ambulatory Visit: Payer: Self-pay | Admitting: Family Medicine

## 2023-10-07 LAB — CBC WITH DIFFERENTIAL/PLATELET
Basophils Absolute: 0 x10E3/uL (ref 0.0–0.2)
Basos: 0 %
EOS (ABSOLUTE): 0.2 x10E3/uL (ref 0.0–0.4)
Eos: 2 %
Hematocrit: 42 % (ref 34.0–46.6)
Hemoglobin: 14 g/dL (ref 11.1–15.9)
Immature Grans (Abs): 0 x10E3/uL (ref 0.0–0.1)
Immature Granulocytes: 0 %
Lymphocytes Absolute: 1.4 x10E3/uL (ref 0.7–3.1)
Lymphs: 16 %
MCH: 30.7 pg (ref 26.6–33.0)
MCHC: 33.3 g/dL (ref 31.5–35.7)
MCV: 92 fL (ref 79–97)
Monocytes Absolute: 0.5 x10E3/uL (ref 0.1–0.9)
Monocytes: 6 %
Neutrophils Absolute: 6.5 x10E3/uL (ref 1.4–7.0)
Neutrophils: 76 %
Platelets: 295 x10E3/uL (ref 150–450)
RBC: 4.56 x10E6/uL (ref 3.77–5.28)
RDW: 12.7 % (ref 11.7–15.4)
WBC: 8.6 x10E3/uL (ref 3.4–10.8)

## 2023-10-07 LAB — COMPREHENSIVE METABOLIC PANEL WITH GFR
ALT: 11 IU/L (ref 0–32)
AST: 18 IU/L (ref 0–40)
Albumin: 4.2 g/dL (ref 3.8–4.8)
Alkaline Phosphatase: 40 IU/L — ABNORMAL LOW (ref 49–135)
BUN/Creatinine Ratio: 17 (ref 12–28)
BUN: 12 mg/dL (ref 8–27)
Bilirubin Total: 0.4 mg/dL (ref 0.0–1.2)
CO2: 26 mmol/L (ref 20–29)
Calcium: 8.9 mg/dL (ref 8.7–10.3)
Chloride: 100 mmol/L (ref 96–106)
Creatinine, Ser: 0.69 mg/dL (ref 0.57–1.00)
Globulin, Total: 2.2 g/dL (ref 1.5–4.5)
Glucose: 83 mg/dL (ref 70–99)
Potassium: 4.6 mmol/L (ref 3.5–5.2)
Sodium: 137 mmol/L (ref 134–144)
Total Protein: 6.4 g/dL (ref 6.0–8.5)
eGFR: 91 mL/min/1.73 (ref >59)

## 2023-10-07 LAB — LIPID PANEL W/O CHOL/HDL RATIO
Cholesterol, Total: 207 mg/dL — ABNORMAL HIGH (ref 100–199)
HDL: 61 mg/dL (ref >39)
LDL Chol Calc (NIH): 132 mg/dL — ABNORMAL HIGH (ref 0–99)
Triglycerides: 76 mg/dL (ref 0–149)
VLDL Cholesterol Cal: 14 mg/dL (ref 5–40)

## 2023-10-07 LAB — TSH: TSH: 2.61 u[IU]/mL (ref 0.450–4.500)

## 2024-01-22 ENCOUNTER — Other Ambulatory Visit: Payer: Self-pay | Admitting: Family Medicine

## 2024-01-24 NOTE — Telephone Encounter (Signed)
 Requested by interface [email or web address]. Future visit 04/07/24. No refills remain.  Requested Prescriptions  Pending Prescriptions Disp Refills   levothyroxine  (SYNTHROID ) 25 MCG tablet [Pharmacy Med Name: LEVOTHYROXINE  25 MCG TABLET] 90 tablet 0    Sig: TAKE 1 TABLET BY MOUTH EVERY DAY BEFORE BREAKFAST     Endocrinology:  Hypothyroid Agents Passed - 01/24/2024  1:16 PM      Passed - TSH in normal range and within 360 days    TSH  Date Value Ref Range Status  10/06/2023 2.610 0.450 - 4.500 uIU/mL Final         Passed - Valid encounter within last 12 months    Recent Outpatient Visits           3 months ago Mixed hyperlipidemia   [facility] [place], Megan P, DO   9 months ago Routine general medical examination at a health care facility   [facility], Megan P, DO

## 2024-03-16 ENCOUNTER — Ambulatory Visit: Payer: Medicare PPO

## 2024-04-07 ENCOUNTER — Encounter: Admitting: Family Medicine
